# Patient Record
Sex: Male | Born: 1945
Health system: Southern US, Community
[De-identification: ages and names within clinical notes are randomized; demographics above are authoritative.]

## PROBLEM LIST (undated history)

## (undated) DIAGNOSIS — E669 Obesity, unspecified: Secondary | ICD-10-CM

## (undated) DIAGNOSIS — M1990 Unspecified osteoarthritis, unspecified site: Secondary | ICD-10-CM

## (undated) DIAGNOSIS — F32A Depression, unspecified: Secondary | ICD-10-CM

## (undated) DIAGNOSIS — Z9989 Dependence on other enabling machines and devices: Secondary | ICD-10-CM

## (undated) DIAGNOSIS — K635 Polyp of colon: Secondary | ICD-10-CM

## (undated) DIAGNOSIS — Z860100 Personal history of colon polyps, unspecified: Secondary | ICD-10-CM

## (undated) DIAGNOSIS — E785 Hyperlipidemia, unspecified: Secondary | ICD-10-CM

## (undated) DIAGNOSIS — G4733 Obstructive sleep apnea (adult) (pediatric): Secondary | ICD-10-CM

## (undated) DIAGNOSIS — N189 Chronic kidney disease, unspecified: Secondary | ICD-10-CM

## (undated) DIAGNOSIS — L4052 Psoriatic arthritis mutilans: Secondary | ICD-10-CM

## (undated) DIAGNOSIS — E119 Type 2 diabetes mellitus without complications: Secondary | ICD-10-CM

## (undated) DIAGNOSIS — I10 Essential (primary) hypertension: Secondary | ICD-10-CM

## (undated) HISTORY — DX: Type 2 diabetes mellitus without complications: E11.9

## (undated) HISTORY — DX: Obstructive sleep apnea (adult) (pediatric): G47.33

## (undated) HISTORY — PX: COLONOSCOPY: SHX174

## (undated) HISTORY — DX: Unspecified osteoarthritis, unspecified site: M19.90

## (undated) HISTORY — DX: Polyp of colon: K63.5

## (undated) HISTORY — DX: Chronic kidney disease, unspecified: N18.9

## (undated) HISTORY — DX: Essential (primary) hypertension: I10

## (undated) HISTORY — DX: Hyperlipidemia, unspecified: E78.5

## (undated) HISTORY — DX: Dependence on other enabling machines and devices: Z99.89

## (undated) HISTORY — DX: Personal history of colon polyps, unspecified: Z86.0100

## (undated) HISTORY — DX: Psoriatic arthritis mutilans: L40.52

## (undated) HISTORY — DX: Obesity, unspecified: E66.9

## (undated) HISTORY — DX: Depression, unspecified: F32.A

## (undated) HISTORY — DX: Personal history of colonic polyps: Z86.010

---

## 2012-09-04 ENCOUNTER — Other Ambulatory Visit: Payer: Self-pay | Admitting: Family Medicine

## 2012-09-04 DIAGNOSIS — R1310 Dysphagia, unspecified: Secondary | ICD-10-CM

## 2012-09-09 ENCOUNTER — Ambulatory Visit
Admission: RE | Admit: 2012-09-09 | Discharge: 2012-09-09 | Disposition: A | Discharge status: Home / Self Care | Payer: 59 | Source: Ambulatory Visit | Attending: Family Medicine | Admitting: Family Medicine

## 2012-09-09 DIAGNOSIS — R1310 Dysphagia, unspecified: Secondary | ICD-10-CM

## 2013-12-02 ENCOUNTER — Ambulatory Visit (INDEPENDENT_AMBULATORY_CARE_PROVIDER_SITE_OTHER): Payer: BLUE CROSS/BLUE SHIELD | Admitting: Psychiatry

## 2013-12-02 ENCOUNTER — Encounter (HOSPITAL_COMMUNITY): Payer: Self-pay | Admitting: Psychiatry

## 2013-12-02 VITALS — BP 149/76 | HR 98 | Ht 67.0 in | Wt 222.0 lb

## 2013-12-02 DIAGNOSIS — F4323 Adjustment disorder with mixed anxiety and depressed mood: Secondary | ICD-10-CM

## 2013-12-02 MED ORDER — BUPROPION HCL 75 MG PO TABS: 75.0000 mg | ORAL_TABLET | Freq: Two times a day (BID) | ORAL | Status: DC

## 2013-12-02 MED ORDER — VENLAFAXINE HCL ER 150 MG PO CP24: 150.0000 mg | ORAL_CAPSULE | Freq: Every day | ORAL | Status: AC

## 2013-12-02 NOTE — Progress Notes (Signed)
Psychiatric Assessment   Patient Identification:  Austin Acosta Date of Evaluation:  12/02/2013 Chief Complaint:     Chief Complaint  Patient presents with  . Depression   History of Chief Complaint:  HPI Comments: HPI Comments: [name] is  a 67 y/o male with a past psychiatric history significant for mood problems. The patient is referred for psychiatric services for psychiatric evaluation and medication management.    . Location: The patient endorses some depression.  . Quality: He reports some significant difficult with retirement and the loss of his social network along with his wife lack of support in his attempt to maintain those social networks.   The patient reports that his main stressors are:  "feeling depressed" "not able to do what he normally does."  In the area of affective symptoms, patient appears mildly depressed. Patient denies current suicidal ideation, intent, or plan. Patient deneis current homicidal ideation, intent, or plan. Patient denies auditory hallucinations. Patient denies visual hallucinations. Patient denies symptoms of paranoia. Patient states sleep is good. Appetite is bad. Energy level is poor. Patient endorses symptoms of anhedonia. Patient endorses hopelessness, helplessness, but denies guilt.   . Severity:  Depression: 5/10 (0=Very depressed; 5=Neutral; 10=Very Happy)  Anxiety- 0-1/10 (0=no anxiety; 5= moderate/tolerable anxiety; 10= panic attacks)  . Duration-Less than 5 years  . Timing-No specific timimng  . Context-loss of social network; loss/death of friends;   Marland Kitchen Modifying factors: Spending time with friends.  . Associated signs and symptoms: As noted below in psychiatric review of systems.   Review of Systems  Constitutional: Negative for fever, chills, activity change, appetite change and fatigue.  Respiratory: Negative for cough, chest tightness, shortness of breath and wheezing.   Cardiovascular: Negative for chest  pain, palpitations and leg swelling.  Gastrointestinal: Negative for nausea, vomiting, abdominal pain, diarrhea, constipation and abdominal distention.  Neurological: Negative for dizziness, tremors, facial asymmetry, weakness, light-headedness, numbness and headaches.   Filed Vitals:   12/02/13 1444  BP: 149/76  Pulse: 98  Height: 5\' 7"  (1.702 m)  Weight: 222 lb (100.699 kg)   Physical Exam  Vitals reviewed. Constitutional: He appears well-developed and well-nourished. No distress.  HENT:  Head: Normocephalic and atraumatic.  Skin: He is not diaphoretic.  Musculoskeletal: Strength & Muscle Tone: within normal limits Gait & Station: normal Patient leans: N/A  Mood Symptoms:  Anhedonia, Appetite, Concentration, Helplessness, Hopelessness, Sadness, Worthlessness,  (Hypo) Manic Symptoms: Elevated Mood:  Negative Irritable Mood:  Negative Grandiosity:  Negative Distractibility:  Negative Labiality of Mood:  Negative Delusions:  Negative Hallucinations:  Negative Impulsivity:  Negative Sexually Inappropriate Behavior:  Negative Financial Extravagance:  Negative Flight of Ideas:  Negative  Anxiety Symptoms: Excessive Worry:  No Panic Symptoms:  No Agoraphobia:  No Obsessive Compulsive: No  Symptoms: None, Specific Phobias:  No Social Anxiety:  No  Psychotic Symptoms:  Hallucinations: Negative None Delusions:  No Paranoia:  No   Ideas of Reference:  No  PTSD Symptoms: Ever had a traumatic exposure:  Negative Had a traumatic exposure in the last month:  Negative Re-experiencing: Negative None Hypervigilance:  Negative Hyperarousal: Negative None Avoidance: Negative None  Traumatic Brain Injury: Negative  Past Psychiatric History: Diagnosis:  Depression and anxiety  Hospitalizations:  Patient denies  Outpatient Care:  Yes in 2003; secondary to depression and severe anger issues,   Substance Abuse Care: Patient denies  Self-Mutilation:  Patient denies   Suicidal Attempts:  Patient denies  Violent Behaviors:  Patient denies   Past Medical  History:   Past Medical History  Diagnosis Date  . Arthritis   . Colon polyps     History of Loss of Consciousness:  Negative Seizure History:  Negative Cardiac History:  Negative  Allergies:   Allergies  Allergen Reactions  . Salmon [Fish Allergy]     Current Medications:  Current Outpatient Prescriptions  Medication Sig Dispense Refill  . buPROPion (WELLBUTRIN) 75 MG tablet Take 75 mg by mouth 2 (two) times daily.      . Certolizumab Pegol (CIMZIA [place]) Inject 200 mg into the skin.      . folic acid (FOLVITE) 1 MG tablet Take 1 mg by mouth daily.      Marland Kitchen venlafaxine XR (EFFEXOR-XR) 150 MG 24 hr capsule Take 150 mg by mouth daily with breakfast.       No current facility-administered medications for this visit.    Previous Psychotropic Medications:  Medication Dose   Paxil-loss of sex drive Unknown  Lexapro-loss of sex drive- Unknown  Venlafaxine-helped went to 225, no difference. 225 mg   Substance Abuse History in the last 12 months: History   Social History  . Marital Status: Married    Spouse Name: N/A    Number of Children: N/A  . Years of Education: N/A   Occupational History  . Not on file.   Social History Main Topics  . Smoking status: Not on file  . Smokeless tobacco: Not on file  . Alcohol Use: Not on file  . Drug Use: Not on file  . Sexual Activity: Not on file   Other Topics Concern  . Not on file   Social History Narrative  . No narrative on file     Medical Consequences of Substance Abuse: Patient denies  Legal Consequences of Substance Abuse:  Patient denies  Family Consequences of Substance Abuse: Patient denies  Blackouts:  Negative DT's:  Negative Withdrawal Symptoms: Negative None  Social History: Current Place of Residence: [place], [place] Place of Birth:  1946/11/30 Family Members: Wife Married: First wife Children: 2  Sons:  1  Daughters:1 Relationships: The patient reports his  School History:   Did poorly school. Legal History: The patient has no significant history of legal issues. Hobbies/Interests: Read.-Use to play golf, used hiking, flying fishing. Religous Beliefs  Family History:   Family History  Problem Relation Age of Onset  . Depression Brother   . Schizophrenia Brother   . Paranoid behavior Brother   . ADD / ADHD Neg Hx   . Alcohol abuse Neg Hx   . Anxiety disorder Neg Hx   . Bipolar disorder Neg Hx   . Diabetes Mellitus II Neg Hx   . Coronary artery disease Father     Psychiatric Specialty Exam: : Objective:  Appearance: Casual and Well Groomed  Eye Contact::  Good  Speech:  Clear and Coherent and Normal Rate  Volume:  Normal  Mood:  "good" Depression: 5/10 (0=Very depressed; 5=Neutral; 10=Very Happy)  Anxiety- 0-1/10 (0=no anxiety; 5= moderate/tolerable anxiety; 10= panic attacks)  Affect:  Appropriate, Congruent and Full Range  Thought Process:  Coherent, Goal Directed, Linear and Logical  Orientation:  Full (Time, Place, and Person)  Thought Content:  WDL  Suicidal Thoughts:  No  Homicidal Thoughts:  No  Judgement:  Good  Memory: Immediate 3/3; Recent 2/3  Insight:  Good  Psychomotor Activity:  Normal  Akathisia:  No  Handed:  Right  AIMS (if indicated):  Not indicated  Assets:  Communication Skills Desire  for Improvement Financial Resources/Insurance Housing Intimacy Leisure Time Physical Health Resilience Social Support Talents/Skills Transportation Vocational/Educational    Laboratory/X-Ray Psychological Evaluation(s)   Not indicated  Not indicated   Assessment:    AXIS I Adjustment Disorder with Mixed Emotional Features  AXIS II No diagnosis  AXIS III Past Medical History  Diagnosis Date  . Arthritis   . Colon polyps     AXIS IV other psychosocial or environmental problems  AXIS V 41-50 serious symptoms   Treatment  Plan/Recommendations:  Plan of Care:  PLAN:  1. Affirm with the patient that the medications are taken as ordered. Patient  expressed understanding of how their medications were to be used.    Laboratory: No labs warranted at this time.   Psychotherapy: Therapy: brief supportive therapy provided.  Discussed psychosocial stressors in detail.  Patient may benefit from individual therapy.  Medications:  Continue  the following psychiatric medications as written prior to this appointment with the following changes::  a) Venlafaxine ER 150 mg b) Bupropion 75 mg BID -Risks and benefits, side effects and alternatives discussed with patient, she was given an opportunity to ask questions about his/her medication, illness, and treatment. All current psychiatric medications have been reviewed and discussed with the patient and adjusted as clinically appropriate. The patient has been provided an accurate and updated list of the medications being now prescribed.   Routine PRN Medications:  Negative  Consultations: The patient was encouraged to keep all PCP and specialty clinic appointments.   Safety Concerns:   Patient told to call clinic if any problems occur. Patient advised to go to  ER  if she should develop SI/HI, side effects, or if symptoms worsen. Has crisis numbers to call if needed.    Other:   8. Patient was instructed to return to clinic in 3 months.  9. The patient was advised to call and cancel their mental health appointment within 24 hours of the appointment, if they are unable to keep the appointment, as well as the three no show and termination from clinic policy. 10. The patient expressed understanding of the plan and agrees with the above.  Time Spent: 60 minutes  Jacqulyn Cane, MD 12/31/20142:25 PM

## 2013-12-06 DIAGNOSIS — F4323 Adjustment disorder with mixed anxiety and depressed mood: Secondary | ICD-10-CM | POA: Insufficient documentation

## 2013-12-16 DIAGNOSIS — L408 Other psoriasis: Secondary | ICD-10-CM | POA: Diagnosis not present

## 2013-12-16 DIAGNOSIS — L4050 Arthropathic psoriasis, unspecified: Secondary | ICD-10-CM | POA: Diagnosis not present

## 2013-12-31 DIAGNOSIS — M069 Rheumatoid arthritis, unspecified: Secondary | ICD-10-CM | POA: Diagnosis not present

## 2013-12-31 DIAGNOSIS — L408 Other psoriasis: Secondary | ICD-10-CM | POA: Diagnosis not present

## 2014-01-29 DIAGNOSIS — L4050 Arthropathic psoriasis, unspecified: Secondary | ICD-10-CM | POA: Diagnosis not present

## 2014-01-29 DIAGNOSIS — E669 Obesity, unspecified: Secondary | ICD-10-CM | POA: Diagnosis not present

## 2014-01-29 DIAGNOSIS — Z6834 Body mass index (BMI) 34.0-34.9, adult: Secondary | ICD-10-CM | POA: Diagnosis not present

## 2014-01-29 DIAGNOSIS — R030 Elevated blood-pressure reading, without diagnosis of hypertension: Secondary | ICD-10-CM | POA: Diagnosis not present

## 2014-01-29 DIAGNOSIS — Z0000 Encounter for general adult medical examination without abnormal findings: Secondary | ICD-10-CM | POA: Diagnosis not present

## 2014-01-29 DIAGNOSIS — E782 Mixed hyperlipidemia: Secondary | ICD-10-CM | POA: Diagnosis not present

## 2014-01-29 DIAGNOSIS — F329 Major depressive disorder, single episode, unspecified: Secondary | ICD-10-CM | POA: Diagnosis not present

## 2014-01-29 DIAGNOSIS — Z125 Encounter for screening for malignant neoplasm of prostate: Secondary | ICD-10-CM | POA: Diagnosis not present

## 2014-01-29 DIAGNOSIS — Z23 Encounter for immunization: Secondary | ICD-10-CM | POA: Diagnosis not present

## 2014-02-02 DIAGNOSIS — L4050 Arthropathic psoriasis, unspecified: Secondary | ICD-10-CM | POA: Diagnosis not present

## 2014-02-22 ENCOUNTER — Ambulatory Visit (HOSPITAL_COMMUNITY): Payer: Self-pay | Admitting: Psychiatry

## 2014-02-24 DIAGNOSIS — M069 Rheumatoid arthritis, unspecified: Secondary | ICD-10-CM | POA: Diagnosis not present

## 2014-03-02 DIAGNOSIS — L408 Other psoriasis: Secondary | ICD-10-CM | POA: Diagnosis not present

## 2014-03-04 ENCOUNTER — Ambulatory Visit (HOSPITAL_COMMUNITY): Payer: Self-pay | Admitting: Psychiatry

## 2014-03-17 DIAGNOSIS — L408 Other psoriasis: Secondary | ICD-10-CM | POA: Diagnosis not present

## 2014-03-17 DIAGNOSIS — L4050 Arthropathic psoriasis, unspecified: Secondary | ICD-10-CM | POA: Diagnosis not present

## 2014-03-31 DIAGNOSIS — L4050 Arthropathic psoriasis, unspecified: Secondary | ICD-10-CM | POA: Diagnosis not present

## 2014-04-29 DIAGNOSIS — L4050 Arthropathic psoriasis, unspecified: Secondary | ICD-10-CM | POA: Diagnosis not present

## 2014-05-27 DIAGNOSIS — L4050 Arthropathic psoriasis, unspecified: Secondary | ICD-10-CM | POA: Diagnosis not present

## 2014-06-24 DIAGNOSIS — L4050 Arthropathic psoriasis, unspecified: Secondary | ICD-10-CM | POA: Diagnosis not present

## 2014-07-21 DIAGNOSIS — M19079 Primary osteoarthritis, unspecified ankle and foot: Secondary | ICD-10-CM | POA: Diagnosis not present

## 2014-07-21 DIAGNOSIS — L408 Other psoriasis: Secondary | ICD-10-CM | POA: Diagnosis not present

## 2014-07-21 DIAGNOSIS — L4050 Arthropathic psoriasis, unspecified: Secondary | ICD-10-CM | POA: Diagnosis not present

## 2014-07-26 DIAGNOSIS — L4050 Arthropathic psoriasis, unspecified: Secondary | ICD-10-CM | POA: Diagnosis not present

## 2014-08-30 DIAGNOSIS — L4050 Arthropathic psoriasis, unspecified: Secondary | ICD-10-CM | POA: Diagnosis not present

## 2014-09-29 DIAGNOSIS — L4050 Arthropathic psoriasis, unspecified: Secondary | ICD-10-CM | POA: Diagnosis not present

## 2014-11-01 DIAGNOSIS — L4050 Arthropathic psoriasis, unspecified: Secondary | ICD-10-CM | POA: Diagnosis not present

## 2014-11-16 DIAGNOSIS — K648 Other hemorrhoids: Secondary | ICD-10-CM | POA: Diagnosis not present

## 2014-11-16 DIAGNOSIS — K5730 Diverticulosis of large intestine without perforation or abscess without bleeding: Secondary | ICD-10-CM | POA: Diagnosis not present

## 2014-11-16 DIAGNOSIS — Z1211 Encounter for screening for malignant neoplasm of colon: Secondary | ICD-10-CM | POA: Diagnosis not present

## 2014-11-16 DIAGNOSIS — Z86010 Personal history of colonic polyps: Secondary | ICD-10-CM | POA: Diagnosis not present

## 2014-11-16 DIAGNOSIS — Z09 Encounter for follow-up examination after completed treatment for conditions other than malignant neoplasm: Secondary | ICD-10-CM | POA: Diagnosis not present

## 2014-11-18 DIAGNOSIS — L4050 Arthropathic psoriasis, unspecified: Secondary | ICD-10-CM | POA: Diagnosis not present

## 2014-11-18 DIAGNOSIS — L401 Generalized pustular psoriasis: Secondary | ICD-10-CM | POA: Diagnosis not present

## 2014-11-18 DIAGNOSIS — Z23 Encounter for immunization: Secondary | ICD-10-CM | POA: Diagnosis not present

## 2014-11-29 DIAGNOSIS — L4050 Arthropathic psoriasis, unspecified: Secondary | ICD-10-CM | POA: Diagnosis not present

## 2014-12-31 DIAGNOSIS — L4050 Arthropathic psoriasis, unspecified: Secondary | ICD-10-CM | POA: Diagnosis not present

## 2015-02-02 DIAGNOSIS — L4050 Arthropathic psoriasis, unspecified: Secondary | ICD-10-CM | POA: Diagnosis not present

## 2015-03-10 DIAGNOSIS — L4050 Arthropathic psoriasis, unspecified: Secondary | ICD-10-CM | POA: Diagnosis not present

## 2015-03-14 DIAGNOSIS — H40053 Ocular hypertension, bilateral: Secondary | ICD-10-CM | POA: Diagnosis not present

## 2015-03-14 DIAGNOSIS — H40013 Open angle with borderline findings, low risk, bilateral: Secondary | ICD-10-CM | POA: Diagnosis not present

## 2015-03-14 DIAGNOSIS — H52203 Unspecified astigmatism, bilateral: Secondary | ICD-10-CM | POA: Diagnosis not present

## 2015-03-14 DIAGNOSIS — H2513 Age-related nuclear cataract, bilateral: Secondary | ICD-10-CM | POA: Diagnosis not present

## 2015-03-22 DIAGNOSIS — R5383 Other fatigue: Secondary | ICD-10-CM | POA: Diagnosis not present

## 2015-03-22 DIAGNOSIS — Z125 Encounter for screening for malignant neoplasm of prostate: Secondary | ICD-10-CM | POA: Diagnosis not present

## 2015-03-22 DIAGNOSIS — G4730 Sleep apnea, unspecified: Secondary | ICD-10-CM | POA: Diagnosis not present

## 2015-03-22 DIAGNOSIS — R6882 Decreased libido: Secondary | ICD-10-CM | POA: Diagnosis not present

## 2015-03-22 DIAGNOSIS — L4052 Psoriatic arthritis mutilans: Secondary | ICD-10-CM | POA: Diagnosis not present

## 2015-03-22 DIAGNOSIS — E782 Mixed hyperlipidemia: Secondary | ICD-10-CM | POA: Diagnosis not present

## 2015-03-22 DIAGNOSIS — R030 Elevated blood-pressure reading, without diagnosis of hypertension: Secondary | ICD-10-CM | POA: Diagnosis not present

## 2015-03-22 DIAGNOSIS — Z6834 Body mass index (BMI) 34.0-34.9, adult: Secondary | ICD-10-CM | POA: Diagnosis not present

## 2015-03-22 DIAGNOSIS — Z0000 Encounter for general adult medical examination without abnormal findings: Secondary | ICD-10-CM | POA: Diagnosis not present

## 2015-03-22 DIAGNOSIS — F339 Major depressive disorder, recurrent, unspecified: Secondary | ICD-10-CM | POA: Diagnosis not present

## 2015-03-22 DIAGNOSIS — E6609 Other obesity due to excess calories: Secondary | ICD-10-CM | POA: Diagnosis not present

## 2015-03-28 ENCOUNTER — Telehealth: Payer: Self-pay | Admitting: Cardiovascular Disease

## 2015-03-28 NOTE — Telephone Encounter (Signed)
03/28/2015 we received incoming fax referral packet on patient from [place] at [facility] it was 16 pages for appointment on 06/10/2015. Records given to [facility]. cbr

## 2015-03-29 DIAGNOSIS — R7309 Other abnormal glucose: Secondary | ICD-10-CM | POA: Diagnosis not present

## 2015-03-29 DIAGNOSIS — E291 Testicular hypofunction: Secondary | ICD-10-CM | POA: Diagnosis not present

## 2015-04-14 DIAGNOSIS — L4050 Arthropathic psoriasis, unspecified: Secondary | ICD-10-CM | POA: Diagnosis not present

## 2015-04-28 DIAGNOSIS — I10 Essential (primary) hypertension: Secondary | ICD-10-CM | POA: Diagnosis not present

## 2015-04-28 DIAGNOSIS — Z79899 Other long term (current) drug therapy: Secondary | ICD-10-CM | POA: Diagnosis not present

## 2015-05-18 ENCOUNTER — Ambulatory Visit: Payer: Self-pay | Admitting: Cardiovascular Disease

## 2015-05-24 DIAGNOSIS — L4050 Arthropathic psoriasis, unspecified: Secondary | ICD-10-CM | POA: Diagnosis not present

## 2015-06-10 ENCOUNTER — Encounter: Payer: Self-pay | Admitting: Cardiovascular Disease

## 2015-06-10 ENCOUNTER — Ambulatory Visit (INDEPENDENT_AMBULATORY_CARE_PROVIDER_SITE_OTHER): Payer: Medicare Other | Admitting: Cardiovascular Disease

## 2015-06-10 VITALS — BP 124/72 | HR 87 | Ht 67.0 in | Wt 218.0 lb

## 2015-06-10 DIAGNOSIS — E785 Hyperlipidemia, unspecified: Secondary | ICD-10-CM

## 2015-06-10 DIAGNOSIS — L4052 Psoriatic arthritis mutilans: Secondary | ICD-10-CM

## 2015-06-10 DIAGNOSIS — E669 Obesity, unspecified: Secondary | ICD-10-CM

## 2015-06-10 DIAGNOSIS — Z8249 Family history of ischemic heart disease and other diseases of the circulatory system: Secondary | ICD-10-CM

## 2015-06-10 DIAGNOSIS — I10 Essential (primary) hypertension: Secondary | ICD-10-CM

## 2015-06-10 DIAGNOSIS — G4733 Obstructive sleep apnea (adult) (pediatric): Secondary | ICD-10-CM

## 2015-06-10 DIAGNOSIS — Z9989 Dependence on other enabling machines and devices: Secondary | ICD-10-CM

## 2015-06-10 NOTE — Patient Instructions (Signed)
Your physician recommends that you schedule a follow-up appointment in: [place].  BRING YOUR CPAP MACHINE TO THAT VISIT.  Your physician has requested that you have an exercise tolerance test. For further information please visit [email or web address]. Please also follow instruction sheet, as given.  WE WILL CALL YOU WITH THE RESULTS.  Dr. Sallyanne Kuster recommends that you schedule a follow-up appointment in: AS NEEDED

## 2015-06-11 ENCOUNTER — Encounter: Payer: Self-pay | Admitting: Cardiovascular Disease

## 2015-06-11 DIAGNOSIS — L4052 Psoriatic arthritis mutilans: Secondary | ICD-10-CM

## 2015-06-11 DIAGNOSIS — G4733 Obstructive sleep apnea (adult) (pediatric): Secondary | ICD-10-CM

## 2015-06-11 DIAGNOSIS — E785 Hyperlipidemia, unspecified: Secondary | ICD-10-CM | POA: Insufficient documentation

## 2015-06-11 DIAGNOSIS — Z9989 Dependence on other enabling machines and devices: Secondary | ICD-10-CM

## 2015-06-11 DIAGNOSIS — E66811 Obesity, class 1: Secondary | ICD-10-CM

## 2015-06-11 DIAGNOSIS — I10 Essential (primary) hypertension: Secondary | ICD-10-CM | POA: Insufficient documentation

## 2015-06-11 DIAGNOSIS — E669 Obesity, unspecified: Secondary | ICD-10-CM

## 2015-06-11 HISTORY — DX: Psoriatic arthritis mutilans: L40.52

## 2015-06-11 HISTORY — DX: Hyperlipidemia, unspecified: E78.5

## 2015-06-11 HISTORY — DX: Essential (primary) hypertension: I10

## 2015-06-11 HISTORY — DX: Obesity, unspecified: E66.9

## 2015-06-11 HISTORY — DX: Obesity, class 1: E66.811

## 2015-06-11 HISTORY — DX: Obstructive sleep apnea (adult) (pediatric): G47.33

## 2015-06-11 NOTE — Progress Notes (Signed)
Patient ID: Austin Acosta, male   DOB: March 28, 1946, 69 y.o.   MRN: [id number]     Cardiology Office Note   Date:  06/11/2015   ID:  Austin Acosta, DOB [birth year], MRN [id number]  PCP:  Mayra Neer, MD  Cardiologist:   Sanda Klein, MD   Chief Complaint  Patient presents with  . Advice Only    Patient has no complaints.      History of Present Illness: Austin Acosta is a 69 y.o. male who presents for  Concerns regarding cardiovascular risk factors. He has never had coronary peripheral arterial problems , but his sister had bypass surgery at age 25. Both his parents had coronary disease as well but more advanced ages in their 47s.   [name] has treated systemic hypertension, moderate obesity , mild hyperlipidemia (primarily elevated LDL cholesterol) , but he has never smoked.  He is very sedentary but does mow his own small lone with a push mower without complaints of chest tightness or intermittent claudication. He believes that for his level of fitness he does not have excessive shortness of breath. On the other hand she describes being tired all the time. He wakes up feeling tired. He has "no spunk". He has been on CPAP therapy for at least 6 years, but has not had a CPAP titration since he had to find a new device provider when hee moved to [place] 5 years ago. His device has not been downloaded since that time. He was recently diagnosed with hypo- testosteronemia , but has yet to start taking supplements.  Past Medical History  Diagnosis Date  . Arthritis   . Colon polyps   . Hyperlipidemia 06/11/2015  . Obstructive sleep apnea on CPAP 06/11/2015  . Obesity (BMI 30.0-34.9) 06/11/2015    History reviewed. No pertinent past surgical history.   Current Outpatient Prescriptions  Medication Sig Dispense Refill  . buPROPion (WELLBUTRIN XL) 150 MG 24 hr tablet Take 150 mg by mouth daily.    . Certolizumab Pegol (CIMZIA [place]) Inject into the skin. monthly    .  lisinopril (PRINIVIL,ZESTRIL) 20 MG tablet Take 20 mg by mouth daily.    Marland Kitchen venlafaxine XR (EFFEXOR-XR) 150 MG 24 hr capsule Take 1 capsule (150 mg total) by mouth daily with breakfast. 30 capsule 2   No current facility-administered medications for this visit.    Allergies:   Salmon    Social History:  The patient  reports that he quit smoking about 31 years ago. His smoking use included Cigarettes. He has a 30 pack-year smoking history. He does not have any smokeless tobacco history on file. He reports that he drinks about 0.5 - 1.0 oz of alcohol per week. He reports that he does not use illicit drugs.   Family History:  The patient's family history includes Coronary artery disease in his father; Depression in his brother; Heart disease in his mother; Paranoid behavior in his brother; Schizophrenia in his brother. There is no history of ADD / ADHD, Alcohol abuse, Anxiety disorder, Bipolar disorder, or Diabetes Mellitus II.    ROS:  Please see the history of present illness.    Otherwise, review of systems positive for  Depression,  Psoriatic skin rash and arthritis symptoms.   All other systems are reviewed and negative.    PHYSICAL EXAM: VS:  BP 124/72 mmHg  Pulse 87  Ht 5\' 7"  (1.702 m)  Wt 218 lb (98.884 kg)  BMI 34.14 kg/m2 , BMI Body mass index is  34.14 kg/(m^2).  General: Alert, oriented x3, no distress Head: no evidence of trauma, PERRL, EOMI, no exophtalmos or lid lag, no myxedema, no xanthelasma; normal ears, nose and oropharynx Neck: normal jugular venous pulsations and no hepatojugular reflux; brisk carotid pulses without delay and no carotid bruits Chest: clear to auscultation, no signs of consolidation by percussion or palpation, normal fremitus, symmetrical and full respiratory excursions Cardiovascular: normal position and quality of the apical impulse, regular rhythm, normal first and second heart sounds, no murmurs, rubs or gallops Abdomen: no tenderness or distention,  no masses by palpation, no abnormal pulsatility or arterial bruits, normal bowel sounds, no hepatosplenomegaly Extremities: no clubbing, cyanosis or edema; 2+ radial, ulnar and brachial pulses bilaterally; 2+ right femoral, posterior tibial and dorsalis pedis pulses; 2+ left femoral, posterior tibial and dorsalis pedis pulses; no subclavian or femoral bruits Neurological: grossly nonfocal Psych: euthymic mood, full affect   EKG:  EKG is not ordered today. The ekg ordered 04/08/2015 demonstrates  Normal sinus rhythm, normal tracing, QTc 421 ms   Recent Labs:  03/22/2015 hemoglobin 14.9, platelets 228 , glucose 129, creatinine 1.07, potassium 4.7, normal liver function tests , total cholesterol 204, triglycerides 172, HDL 50, LDL 119 , TSH 2.89, testosterone 187.7  Wt Readings from Last 3 Encounters:  06/10/15 218 lb (98.884 kg)  12/02/13 222 lb (100.699 kg)      Other studies Reviewed: Additional studies/ records that were reviewed today include: records from Dr. Mayra Neer.   ASSESSMENT AND PLAN:  1.  Fatigue and OSA -  While this may be related to depression and/or hyperandrogenism, I believe a leading possibility is that his current CPAP settings are not appropriate year they have not been reviewed in many years. It sounds like his CPAP machine does have a memory chip. I have advised that he have a sleep clinic evaluation at least once with Dr. Ellouise Newer to make sure that the device settings do not need to be reprogrammed. I also encouraged him to use the AndroGel supplements prescribed by Dr. Brigitte Pulse.  2.  Obesity -  This underlies his tendency to develop diabetes mellitus and hypertension and sleep apnea and may also be partly responsible for his elevated LDL cholesterol. Weight loss is strongly recommended. He does not appear at all motivated. He does not enjoy physical activity low in the past he used to golf and play tennis. We discussed healthy dietary changes as well.  3.   Multiple coronary risk factors -  I recommended that he undergo a simple treadmill stress test. In the absence of established vascular disease his current level of LDL cholesterol does not justify starting treatment with a statin , but if he does develop full-blown diabetes or any vascular disease equivalent is identified a statin would definitely be indicated.    Current medicines are reviewed at length with the patient today.  The patient does not have concerns regarding medicines.  The following changes have been made:  no change  Labs/ tests ordered today include:  Orders Placed This Encounter  Procedures  . Exercise Tolerance Test     Patient Instructions  Your physician recommends that you schedule a follow-up appointment in: [place].  BRING YOUR CPAP MACHINE TO THAT VISIT.  Your physician has requested that you have an exercise tolerance test. For further information please visit [email or web address]. Please also follow instruction sheet, as given.  WE WILL CALL YOU WITH THE RESULTS.  Dr. Sallyanne Kuster  recommends that you schedule a follow-up appointment in: AS NEEDED        Signed, Sanda Klein, MD  06/11/2015 8:34 AM    Sanda Klein, MD, [facility] [phone] office [phone] pager

## 2015-06-23 ENCOUNTER — Ambulatory Visit: Payer: Self-pay | Admitting: Cardiovascular Disease

## 2015-06-23 DIAGNOSIS — L4050 Arthropathic psoriasis, unspecified: Secondary | ICD-10-CM | POA: Diagnosis not present

## 2015-06-28 ENCOUNTER — Encounter (HOSPITAL_COMMUNITY): Payer: Self-pay

## 2015-06-30 ENCOUNTER — Telehealth (HOSPITAL_COMMUNITY): Payer: Self-pay

## 2015-06-30 NOTE — Telephone Encounter (Signed)
Encounter complete. 

## 2015-07-01 ENCOUNTER — Telehealth (HOSPITAL_COMMUNITY): Payer: Self-pay

## 2015-07-01 NOTE — Telephone Encounter (Signed)
Encounter complete. 

## 2015-07-05 ENCOUNTER — Encounter (HOSPITAL_COMMUNITY): Payer: Self-pay | Admitting: *Deleted

## 2015-07-05 ENCOUNTER — Encounter (HOSPITAL_COMMUNITY): Payer: Self-pay

## 2015-07-05 ENCOUNTER — Ambulatory Visit (HOSPITAL_COMMUNITY)
Admission: RE | Admit: 2015-07-05 | Discharge: 2015-07-05 | Disposition: A | Discharge status: Home / Self Care | Payer: Medicare Other | Source: Ambulatory Visit | Attending: Cardiovascular Disease | Admitting: Cardiovascular Disease

## 2015-07-05 DIAGNOSIS — Z8249 Family history of ischemic heart disease and other diseases of the circulatory system: Secondary | ICD-10-CM | POA: Diagnosis not present

## 2015-07-05 DIAGNOSIS — R9439 Abnormal result of other cardiovascular function study: Secondary | ICD-10-CM | POA: Insufficient documentation

## 2015-07-05 LAB — EXERCISE TOLERANCE TEST
CHL CUP MPHR: 151 {beats}/min
CHL RATE OF PERCEIVED EXERTION: 17
CSEPEW: 8.5 METS
CSEPHR: 98 %
CSEPPHR: 148 {beats}/min
Exercise duration (min): 7 min
Rest HR: 81 {beats}/min

## 2015-07-05 NOTE — Progress Notes (Unsigned)
Patient has ST changes, ok'd by Dr. Gwenlyn Found to go home.

## 2015-07-08 ENCOUNTER — Telehealth: Payer: Self-pay | Admitting: Cardiovascular Disease

## 2015-07-08 ENCOUNTER — Other Ambulatory Visit: Payer: Self-pay | Admitting: *Deleted

## 2015-07-08 DIAGNOSIS — Z9989 Dependence on other enabling machines and devices: Secondary | ICD-10-CM

## 2015-07-08 DIAGNOSIS — Z87891 Personal history of nicotine dependence: Secondary | ICD-10-CM

## 2015-07-08 DIAGNOSIS — Z8249 Family history of ischemic heart disease and other diseases of the circulatory system: Secondary | ICD-10-CM

## 2015-07-08 DIAGNOSIS — E785 Hyperlipidemia, unspecified: Secondary | ICD-10-CM

## 2015-07-08 DIAGNOSIS — G4733 Obstructive sleep apnea (adult) (pediatric): Secondary | ICD-10-CM

## 2015-07-08 DIAGNOSIS — R9439 Abnormal result of other cardiovascular function study: Secondary | ICD-10-CM

## 2015-07-08 DIAGNOSIS — I10 Essential (primary) hypertension: Secondary | ICD-10-CM

## 2015-07-08 DIAGNOSIS — R0602 Shortness of breath: Secondary | ICD-10-CM

## 2015-07-08 DIAGNOSIS — R5383 Other fatigue: Secondary | ICD-10-CM

## 2015-07-08 NOTE — Progress Notes (Signed)
Order placed for lexiscan myoview due to abnormal EKG per [facility]

## 2015-07-08 NOTE — Telephone Encounter (Signed)
Closed encounter °

## 2015-07-12 ENCOUNTER — Ambulatory Visit (HOSPITAL_COMMUNITY)
Admission: RE | Admit: 2015-07-12 | Discharge: 2015-07-12 | Disposition: A | Discharge status: Home / Self Care | Payer: Medicare Other | Source: Ambulatory Visit | Attending: Urology | Admitting: Urology

## 2015-07-12 DIAGNOSIS — Z720 Tobacco use: Secondary | ICD-10-CM

## 2015-07-12 DIAGNOSIS — Z8249 Family history of ischemic heart disease and other diseases of the circulatory system: Secondary | ICD-10-CM | POA: Diagnosis not present

## 2015-07-12 DIAGNOSIS — G4733 Obstructive sleep apnea (adult) (pediatric): Secondary | ICD-10-CM | POA: Diagnosis not present

## 2015-07-12 DIAGNOSIS — Z87891 Personal history of nicotine dependence: Secondary | ICD-10-CM

## 2015-07-12 DIAGNOSIS — I10 Essential (primary) hypertension: Secondary | ICD-10-CM | POA: Diagnosis not present

## 2015-07-12 DIAGNOSIS — E785 Hyperlipidemia, unspecified: Secondary | ICD-10-CM | POA: Insufficient documentation

## 2015-07-12 DIAGNOSIS — Z9989 Dependence on other enabling machines and devices: Secondary | ICD-10-CM

## 2015-07-12 DIAGNOSIS — R9439 Abnormal result of other cardiovascular function study: Secondary | ICD-10-CM | POA: Diagnosis not present

## 2015-07-12 LAB — MYOCARDIAL PERFUSION IMAGING
CHL CUP NUCLEAR SDS: 1
CHL CUP RESTING HR STRESS: 65 {beats}/min
LVDIAVOL: 102 mL
LVSYSVOL: 50 mL
NUC STRESS TID: 1.59
Peak HR: 86 {beats}/min
SRS: 0
SSS: 1

## 2015-07-12 MED ORDER — REGADENOSON 0.4 MG/5ML IV SOLN
0.4000 mg | Freq: Once | INTRAVENOUS | Status: AC
  Administered 2015-07-12: 0.4 mg via INTRAVENOUS

## 2015-07-12 MED ORDER — AMINOPHYLLINE 25 MG/ML IV SOLN
75.0000 mg | Freq: Once | INTRAVENOUS | Status: AC
  Administered 2015-07-12: 75 mg via INTRAVENOUS

## 2015-07-12 MED ORDER — TECHNETIUM TC 99M SESTAMIBI GENERIC - CARDIOLITE
30.2000 | Freq: Once | INTRAVENOUS | Status: AC | PRN
  Administered 2015-07-12: 30.2 via INTRAVENOUS

## 2015-07-12 MED ORDER — TECHNETIUM TC 99M SESTAMIBI GENERIC - CARDIOLITE
10.4000 | Freq: Once | INTRAVENOUS | Status: AC | PRN
  Administered 2015-07-12: 10 via INTRAVENOUS

## 2015-07-12 NOTE — Progress Notes (Signed)
LM - will call back in AM.

## 2015-07-18 ENCOUNTER — Ambulatory Visit: Payer: Self-pay | Admitting: Cardiovascular Disease

## 2015-07-19 DIAGNOSIS — E291 Testicular hypofunction: Secondary | ICD-10-CM | POA: Diagnosis not present

## 2015-08-02 DIAGNOSIS — L401 Generalized pustular psoriasis: Secondary | ICD-10-CM | POA: Diagnosis not present

## 2015-08-02 DIAGNOSIS — L4050 Arthropathic psoriasis, unspecified: Secondary | ICD-10-CM | POA: Diagnosis not present

## 2015-08-19 DIAGNOSIS — E291 Testicular hypofunction: Secondary | ICD-10-CM | POA: Diagnosis not present

## 2015-08-25 ENCOUNTER — Other Ambulatory Visit (HOSPITAL_COMMUNITY): Payer: Self-pay | Admitting: Pharmacist

## 2015-08-30 ENCOUNTER — Other Ambulatory Visit (HOSPITAL_COMMUNITY): Payer: Medicare Other | Admitting: Internal Medicine

## 2015-08-30 ENCOUNTER — Other Ambulatory Visit (HOSPITAL_COMMUNITY): Payer: Self-pay | Admitting: Internal Medicine

## 2015-08-30 ENCOUNTER — Encounter (HOSPITAL_COMMUNITY)
Admission: RE | Admit: 2015-08-30 | Discharge: 2015-08-30 | Disposition: A | Discharge status: Home / Self Care | Payer: Medicare Other | Source: Ambulatory Visit | Attending: Internal Medicine | Admitting: Internal Medicine

## 2015-08-30 ENCOUNTER — Encounter (HOSPITAL_COMMUNITY): Payer: Self-pay

## 2015-08-30 DIAGNOSIS — L4050 Arthropathic psoriasis, unspecified: Secondary | ICD-10-CM | POA: Insufficient documentation

## 2015-08-30 MED ORDER — USTEKINUMAB 45 MG/0.5ML ~~LOC~~ SOSY
45.0000 mg | PREFILLED_SYRINGE | Freq: Once | SUBCUTANEOUS | Status: AC
  Administered 2015-08-30: 45 mg via SUBCUTANEOUS
  Filled 2015-08-30: qty 0.5

## 2015-08-30 NOTE — Discharge Instructions (Signed)
Ustekinumab injection What is this medicine? USTEKINUMAB ([place] te KIN ue mab) is used to treat plaque psoriasis and psoriatic arthritis. It is not a cure. This medicine may be used for other purposes; ask your health care provider or pharmacist if you have questions. COMMON BRAND NAME(S): Stelara What should I tell my health care provider before I take this medicine? They need to know if you have any of these conditions: -cancer -diabetes -immune system problems -infection (especially a virus infection such as chickenpox, cold sores, or herpes)or history of infections -receiving or have received allergy shots -recently received or scheduled to receive a vaccine -tuberculosis, a positive skin test for tuberculosis, or have recently been in close contact with someone who has tuberculosis -an unusual reaction to ustekinumab, other medicines, foods, dyes, or preservatives -pregnant or trying to get pregnant -breast-feeding How should I use this medicine? This medicine is for injection under the skin. You will be taught how to prepare and give this medicine. Use exactly as directed. Take your medicine at regular intervals. Do not take your medicine more often than directed. It is important that you put your used needles and syringes in a special sharps container. Do not put them in a trash can. If you do not have a sharps container, call your pharmacist or healthcare provider to get one. A special MedGuide will be given to you by the pharmacist with each prescription and refill. Be sure to read this information carefully each time. Talk to your pediatrician regarding the use of this medicine in children. Special care may be needed. Overdosage: If you think you've taken too much of this medicine contact a poison control center or emergency room at once. Overdosage: If you think you have taken too much of this medicine contact a poison control center or emergency room at once. NOTE: This medicine is  only for you. Do not share this medicine with others. What if I miss a dose? If you miss a dose, take it as soon as you can. If it is almost time for your next dose, take only that dose. Do not take double or extra doses. What may interact with this medicine? Do not take this medicine with any of the following medications: -live virus vaccines This medicine may also interact with the following medications: -cyclosporine -immunosuppressives -vaccines -warfarin This list may not describe all possible interactions. Give your health care provider a list of all the medicines, herbs, non-prescription drugs, or dietary supplements you use. Also tell them if you smoke, drink alcohol, or use illegal drugs. Some items may interact with your medicine. What should I watch for while using this medicine? Your condition will be monitored carefully while you are receiving this medicine. Tell your doctor or healthcare professional if your symptoms do not start to get better or if they get worse. You will be tested for tuberculosis (TB) before you start this medicine. If your doctor prescribes any medicine for TB, you should start taking the TB medicine before starting this medicine. Make sure to finish the full course of TB medicine. Call your doctor or health care professional if you get a cold or other infection while receiving this medicine. Do not treat yourself. This medicine may decrease your body's ability to fight infection. Talk to your doctor about your risk of cancer. You may be more at risk for certain types of cancers if you take this medicine. What side effects may I notice from receiving this medicine? Side effects that you  should report to your doctor or health care professional as soon as possible: -allergic reactions like skin rash, itching or hives, swelling of the face, lips, or tongue -breathing problems -changes in vision -confusion -fever, chills, or any other sign of  infection -seizures -swollen lymph nodes in the neck, underarm, or groin areas -unexplained weight loss -unusually weak or tired Side effects that usually do not require medical attention (Report these to your doctor or health care professional if they continue or are bothersome.): -headache -redness, itching, swelling, or bruising at site where injected This list may not describe all possible side effects. Call your doctor for medical advice about side effects. You may report side effects to FDA at 1-800-FDA-1088. Where should I keep my medicine? Keep out of the reach of children. If you are using this medicine at home, you will be instructed on how to store this medicine. Throw away any unused medicine after the expiration date on the label. NOTE: This sheet is a summary. It may not cover all possible information. If you have questions about this medicine, talk to your doctor, pharmacist, or health care provider.  2015, [employer]vier/Gold Standard. (2012-11-04 16:31:45)

## 2015-08-30 NOTE — Progress Notes (Signed)
Pt received Stelara injection today, 1st dose. Given to the right lower abdomen.  Pt stayed 30 minutes post injection, no adverse reactions noted.  Vss, afebrile.  Pt d/c ambulatory to lobby.  D/c instruction on Stelara were given to pt and explained.

## 2015-08-31 MED FILL — Ustekinumab Soln Prefilled Syringe 45 MG/0.5ML: SUBCUTANEOUS | Qty: 0.5 | Status: AC

## 2015-09-16 DIAGNOSIS — E291 Testicular hypofunction: Secondary | ICD-10-CM | POA: Diagnosis not present

## 2015-09-26 ENCOUNTER — Encounter: Payer: Self-pay | Admitting: Cardiovascular Disease

## 2015-09-26 ENCOUNTER — Ambulatory Visit (INDEPENDENT_AMBULATORY_CARE_PROVIDER_SITE_OTHER): Payer: Medicare Other | Admitting: Cardiovascular Disease

## 2015-09-26 VITALS — BP 128/74 | HR 93 | Ht 67.0 in | Wt 221.4 lb

## 2015-09-26 DIAGNOSIS — G4733 Obstructive sleep apnea (adult) (pediatric): Secondary | ICD-10-CM

## 2015-09-26 DIAGNOSIS — Z9989 Dependence on other enabling machines and devices: Secondary | ICD-10-CM

## 2015-09-26 DIAGNOSIS — E785 Hyperlipidemia, unspecified: Secondary | ICD-10-CM

## 2015-09-26 DIAGNOSIS — I10 Essential (primary) hypertension: Secondary | ICD-10-CM

## 2015-09-26 NOTE — Patient Instructions (Signed)
Dr. Sallyanne Kuster recommends that you schedule a follow-up appointment in: ONE YEAR.  OUR GOAL IS FOR YOU TO HAVE LOST 29 LBS DURING THIS NEXT YEAR.  GOOD LUCK!!!

## 2015-09-26 NOTE — Progress Notes (Signed)
Patient ID: Austin Acosta, male   DOB: June 24, 1946, 69 y.o.   MRN: [id number]      Cardiology Office Note   Date:  09/26/2015   ID:  Austin Acosta, DOB 16-Apr-1946, MRN [id number]  PCP:  Mayra Neer, MD  Cardiologist:   Sanda Klein, MD   Chief Complaint  Patient presents with  . follow up    STRESS TEST RESULTS.  No complaints of chest pain, SOB or edema.  Mild dizziness while on Lisinopril      History of Present Illness: Austin Acosta is a 69 y.o. male who presents for  Follow-up after undergoing nuclear perfusion stress test. His simple treadmill stress test was abnormal. The nuclear study shows a normal pattern of myocardial perfusion except for mild diaphragmatic attenuation artifact. There was  Also reported mild diffuse hypokinesis with an ejection fraction of 51%. He does not have any clinical manifestations of congestive heart failure, denying exertional dyspnea or edema. His fatigue is substantially improved after he began treatment with testosterone for androgen deficiency. He seems to be expressing more interest in physical activity and improvement in his diet and weight reduction. He does not have angina pectoris.    Past Medical History  Diagnosis Date  . Arthritis   . Colon polyps   . Hyperlipidemia 06/11/2015  . Obstructive sleep apnea on CPAP 06/11/2015  . Obesity (BMI 30.0-34.9) 06/11/2015  . Essential hypertension 06/11/2015  . 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   Current Outpatient Prescriptions  Medication Sig Dispense Refill  . buPROPion (WELLBUTRIN XL) 150 MG 24 hr tablet Take 150 mg by mouth daily.    . Certolizumab Pegol (CIMZIA [place]) Inject into the skin. monthly    . losartan-hydrochlorothiazide (HYZAAR) 100-25 MG tablet Take 1 tablet by mouth daily.    Marland Kitchen testosterone cypionate (DEPOTESTOSTERONE CYPIONATE) 200 MG/ML injection every 30 (thirty) days.  0  . ustekinumab (STELARA) 45 MG/0.5ML SOSY  injection Inject 45 mg into the skin every 3 (three) months.    . venlafaxine XR (EFFEXOR-XR) 150 MG 24 hr capsule Take 1 capsule (150 mg total) by mouth daily with breakfast. 30 capsule 2   No current facility-administered medications for this visit.    Allergies:   Salmon and Latex    Social History:  The patient  reports that he quit smoking about 31 years ago. His smoking use included Cigarettes. He has a 30 pack-year smoking history. He does not have any smokeless tobacco history on file. He reports that he drinks about 0.5 - 1.0 oz of alcohol per week. He reports that he does not use illicit drugs.   Family History:  The patient's family history includes Coronary artery disease in his father; Depression in his brother; Heart disease in his mother; Paranoid behavior in his brother; Schizophrenia in his brother. There is no history of ADD / ADHD, Alcohol abuse, Anxiety disorder, Bipolar disorder, or Diabetes Mellitus II.    ROS:  Please see the history of present illness.    Otherwise, review of systems positive for  Improved mood and exercise tolerance.   All other systems are reviewed and negative.    PHYSICAL EXAM: VS:  BP 128/74 mmHg  Pulse 93  Ht 5\' 7"  (1.702 m)  Wt 221 lb 6.4 oz (100.426 kg)  BMI 34.67 kg/m2 , BMI Body mass index is 34.67 kg/(m^2).  General: Alert, oriented x3, no distress Head: no evidence of trauma, PERRL, EOMI, no exophtalmos  or lid lag, no myxedema, no xanthelasma; normal ears, nose and oropharynx Neck: normal jugular venous pulsations and no hepatojugular reflux; brisk carotid pulses without delay and no carotid bruits Chest: clear to auscultation, no signs of consolidation by percussion or palpation, normal fremitus, symmetrical and full respiratory excursions Cardiovascular: normal position and quality of the apical impulse, regular rhythm, normal first and second heart sounds, no murmurs, rubs or gallops Abdomen: no tenderness or distention, no masses  by palpation, no abnormal pulsatility or arterial bruits, normal bowel sounds, no hepatosplenomegaly Extremities: no clubbing, cyanosis or edema; 2+ radial, ulnar and brachial pulses bilaterally; 2+ right femoral, posterior tibial and dorsalis pedis pulses; 2+ left femoral, posterior tibial and dorsalis pedis pulses; no subclavian or femoral bruits Neurological: grossly nonfocal Psych: euthymic mood, full affect   EKG:  EKG is not ordered today.  Recent Labs: No results found for requested labs within last 365 days.    Lipid Panel No results found for: CHOL, TRIG, HDL, CHOLHDL, VLDL, LDLCALC, LDLDIRECT    Wt Readings from Last 3 Encounters:  09/26/15 221 lb 6.4 oz (100.426 kg)  08/30/15 218 lb (98.884 kg)  07/12/15 218 lb (98.884 kg)     ASSESSMENT AND PLAN:  1.  OSA -  His fatigue seems to have responded well to androgen supplements , but I still think it might be a good idea for his CPAP prescription to be reviewed by Dr. Claiborne Billings.  2.  Obesity-  We spent quite a while discussing importance of weight loss, calorie restriction, regular exercise and the importance of a reduction in carbohydrates and increasing protein and unsaturated fat. We set an initial target weight of 192 pounds which would bring him down to overweight rather than obese range. He will try to tackle this, with the help of his wife, over the next 12 months.  3.  Essential hypertension, well controlled  4.  Mild hyperlipidemia (LDL cholesterol in 100-130 range ) , I don't think pharmacological therapy is indicated at this point , but may be necessary if last all changes do not lead to improvement.  If vascular disease or diabetes mellitus develop, he should start a statin.  5.  Borderline LV dysfunction by nuclear scintigraphy. There is no clinical heart failure. We discussed the occasional artifactual readings with gated images. He is already taking an angiotensin receptor blocker. Beta blocker therapy might make his  fatigue worse and is not clearly indicated. Offered echocardiography to confirm the detected abnormality, but I don't think this would lead to any immediate change in treatment. Follow-up study in a year. Reevaluate sooner if she develops exertional dyspnea.    Current medicines are reviewed at length with the patient today.  The patient does not have concerns regarding medicines.  The following changes have been made:  no change  Labs/ tests ordered today include:  No orders of the defined types were placed in this encounter.     Patient Instructions  Dr. Sallyanne Kuster recommends that you schedule a follow-up appointment in: [place].  OUR GOAL IS FOR YOU TO HAVE LOST 29 LBS DURING THIS NEXT YEAR.  GOOD LUCK!!!       Mikael Spray, MD  09/26/2015 9:20 PM    Sanda Klein, MD, [facility] [phone] office [phone] pager

## 2015-09-27 ENCOUNTER — Encounter (HOSPITAL_COMMUNITY): Payer: Self-pay

## 2015-09-27 ENCOUNTER — Encounter (HOSPITAL_COMMUNITY)
Admission: RE | Admit: 2015-09-27 | Discharge: 2015-09-27 | Disposition: A | Discharge status: Home / Self Care | Payer: Medicare Other | Source: Ambulatory Visit | Attending: Internal Medicine | Admitting: Internal Medicine

## 2015-09-27 DIAGNOSIS — L4050 Arthropathic psoriasis, unspecified: Secondary | ICD-10-CM | POA: Diagnosis not present

## 2015-09-27 MED ORDER — USTEKINUMAB 45 MG/0.5ML ~~LOC~~ SOSY
45.0000 mg | PREFILLED_SYRINGE | Freq: Once | SUBCUTANEOUS | Status: AC
  Administered 2015-09-27: 45 mg via SUBCUTANEOUS
  Filled 2015-09-27: qty 0.5

## 2015-09-27 NOTE — Progress Notes (Signed)
Uneventful 2nd injection of STELARA RL Abdomen. Pt was discharged post injection since he had no problems with previous injection but will call Dr Amil Amen for questions or concerns. His next appt is scheduled as ordered for 12 weeks and is 12/20/15. Pt verbalized understanding of this

## 2015-10-19 DIAGNOSIS — E291 Testicular hypofunction: Secondary | ICD-10-CM | POA: Diagnosis not present

## 2015-11-02 DIAGNOSIS — L4050 Arthropathic psoriasis, unspecified: Secondary | ICD-10-CM | POA: Diagnosis not present

## 2015-11-02 DIAGNOSIS — L401 Generalized pustular psoriasis: Secondary | ICD-10-CM | POA: Diagnosis not present

## 2015-11-03 DIAGNOSIS — Z23 Encounter for immunization: Secondary | ICD-10-CM | POA: Diagnosis not present

## 2015-11-18 DIAGNOSIS — E291 Testicular hypofunction: Secondary | ICD-10-CM | POA: Diagnosis not present

## 2015-12-16 DIAGNOSIS — E291 Testicular hypofunction: Secondary | ICD-10-CM | POA: Diagnosis not present

## 2015-12-20 ENCOUNTER — Encounter (HOSPITAL_COMMUNITY)
Admission: RE | Admit: 2015-12-20 | Discharge: 2015-12-20 | Disposition: A | Discharge status: Home / Self Care | Payer: Medicare Other | Source: Ambulatory Visit | Attending: Internal Medicine | Admitting: Internal Medicine

## 2015-12-20 DIAGNOSIS — L4050 Arthropathic psoriasis, unspecified: Secondary | ICD-10-CM | POA: Diagnosis not present

## 2015-12-20 MED ORDER — USTEKINUMAB 45 MG/0.5ML ~~LOC~~ SOSY
45.0000 mg | PREFILLED_SYRINGE | Freq: Once | SUBCUTANEOUS | Status: AC
  Administered 2015-12-20: 45 mg via SUBCUTANEOUS
  Filled 2015-12-20: qty 0.5

## 2015-12-20 NOTE — Discharge Instructions (Signed)
STELARA Ustekinumab injection What is this medicine? USTEKINUMAB ([place] te KIN ue mab) is used to treat plaque psoriasis and psoriatic arthritis. It is not a cure. This medicine may be used for other purposes; ask your health care provider or pharmacist if you have questions. What should I tell my health care provider before I take this medicine? They need to know if you have any of these conditions: -cancer -diabetes -immune system problems -infection (especially a virus infection such as chickenpox, cold sores, or herpes)or history of infections -receiving or have received allergy shots -recently received or scheduled to receive a vaccine -tuberculosis, a positive skin test for tuberculosis, or have recently been in close contact with someone who has tuberculosis -an unusual reaction to ustekinumab, other medicines, foods, dyes, or preservatives -pregnant or trying to get pregnant -breast-feeding How should I use this medicine? This medicine is for injection under the skin. You will be taught how to prepare and give this medicine. Use exactly as directed. Take your medicine at regular intervals. Do not take your medicine more often than directed. It is important that you put your used needles and syringes in a special sharps container. Do not put them in a trash can. If you do not have a sharps container, call your pharmacist or healthcare provider to get one. A special MedGuide will be given to you by the pharmacist with each prescription and refill. Be sure to read this information carefully each time. Talk to your pediatrician regarding the use of this medicine in children. Special care may be needed. Overdosage: If you think you have taken too much of this medicine contact a poison control center or emergency room at once. NOTE: This medicine is only for you. Do not share this medicine with others. What if I miss a dose? If you miss a dose, take it as soon as you can. If it is almost time  for your next dose, take only that dose. Do not take double or extra doses. What may interact with this medicine? Do not take this medicine with any of the following medications: -live virus vaccines This medicine may also interact with the following medications: -cyclosporine -immunosuppressives -vaccines -warfarin This list may not describe all possible interactions. Give your health care provider a list of all the medicines, herbs, non-prescription drugs, or dietary supplements you use. Also tell them if you smoke, drink alcohol, or use illegal drugs. Some items may interact with your medicine. What should I watch for while using this medicine? Your condition will be monitored carefully while you are receiving this medicine. Tell your doctor or healthcare professional if your symptoms do not start to get better or if they get worse. You will be tested for tuberculosis (TB) before you start this medicine. If your doctor prescribes any medicine for TB, you should start taking the TB medicine before starting this medicine. Make sure to finish the full course of TB medicine. Call your doctor or health care professional if you get a cold or other infection while receiving this medicine. Do not treat yourself. This medicine may decrease your body's ability to fight infection. Talk to your doctor about your risk of cancer. You may be more at risk for certain types of cancers if you take this medicine. What side effects may I notice from receiving this medicine? Side effects that you should report to your doctor or health care professional as soon as possible: -allergic reactions like skin rash, itching or hives, swelling of the  face, lips, or tongue -breathing problems -changes in vision -confusion -fever, chills, or any other sign of infection -seizures -swollen lymph nodes in the neck, underarm, or groin areas -unexplained weight loss -unusually weak or tired Side effects that usually do not  require medical attention (Report these to your doctor or health care professional if they continue or are bothersome.): -headache -redness, itching, swelling, or bruising at site where injected This list may not describe all possible side effects. Call your doctor for medical advice about side effects. You may report side effects to FDA at 1-800-FDA-1088. Where should I keep my medicine? Keep out of the reach of children. If you are using this medicine at home, you will be instructed on how to store this medicine. Throw away any unused medicine after the expiration date on the label. NOTE: This sheet is a summary. It may not cover all possible information. If you have questions about this medicine, talk to your doctor, pharmacist, or health care provider.    2016, Elsevier/Gold Standard. (2012-11-04 16:31:45)

## 2016-01-16 DIAGNOSIS — E291 Testicular hypofunction: Secondary | ICD-10-CM | POA: Diagnosis not present

## 2016-02-01 DIAGNOSIS — M25562 Pain in left knee: Secondary | ICD-10-CM | POA: Diagnosis not present

## 2016-02-01 DIAGNOSIS — E291 Testicular hypofunction: Secondary | ICD-10-CM | POA: Diagnosis not present

## 2016-02-01 DIAGNOSIS — L4050 Arthropathic psoriasis, unspecified: Secondary | ICD-10-CM | POA: Diagnosis not present

## 2016-02-01 DIAGNOSIS — L401 Generalized pustular psoriasis: Secondary | ICD-10-CM | POA: Diagnosis not present

## 2016-02-22 DIAGNOSIS — I10 Essential (primary) hypertension: Secondary | ICD-10-CM | POA: Diagnosis not present

## 2016-02-22 DIAGNOSIS — E669 Obesity, unspecified: Secondary | ICD-10-CM | POA: Diagnosis not present

## 2016-02-22 DIAGNOSIS — G4733 Obstructive sleep apnea (adult) (pediatric): Secondary | ICD-10-CM | POA: Diagnosis not present

## 2016-02-22 DIAGNOSIS — Z6835 Body mass index (BMI) 35.0-35.9, adult: Secondary | ICD-10-CM | POA: Diagnosis not present

## 2016-03-07 DIAGNOSIS — E291 Testicular hypofunction: Secondary | ICD-10-CM | POA: Diagnosis not present

## 2016-03-08 DIAGNOSIS — G4733 Obstructive sleep apnea (adult) (pediatric): Secondary | ICD-10-CM | POA: Diagnosis not present

## 2016-03-12 DIAGNOSIS — H2513 Age-related nuclear cataract, bilateral: Secondary | ICD-10-CM | POA: Diagnosis not present

## 2016-03-12 DIAGNOSIS — H524 Presbyopia: Secondary | ICD-10-CM | POA: Diagnosis not present

## 2016-03-13 ENCOUNTER — Other Ambulatory Visit (HOSPITAL_COMMUNITY): Payer: Self-pay | Admitting: Internal Medicine

## 2016-03-13 ENCOUNTER — Ambulatory Visit (HOSPITAL_COMMUNITY)
Admission: RE | Admit: 2016-03-13 | Discharge: 2016-03-13 | Disposition: A | Discharge status: Home / Self Care | Payer: Medicare Other | Source: Ambulatory Visit | Attending: Internal Medicine | Admitting: Internal Medicine

## 2016-03-13 ENCOUNTER — Encounter (HOSPITAL_COMMUNITY): Payer: Self-pay

## 2016-03-13 DIAGNOSIS — L4050 Arthropathic psoriasis, unspecified: Secondary | ICD-10-CM | POA: Diagnosis not present

## 2016-03-13 MED ORDER — USTEKINUMAB 90 MG/ML ~~LOC~~ SOSY
90.0000 mg | PREFILLED_SYRINGE | SUBCUTANEOUS | Status: AC
  Administered 2016-03-13: 90 mg via SUBCUTANEOUS
  Filled 2016-03-13: qty 1

## 2016-03-13 NOTE — Discharge Instructions (Signed)
Ustekinumab injection °What is this medicine? °USTEKINUMAB (US te KIN ue mab) is used to treat plaque psoriasis and psoriatic arthritis. It is not a cure. °This medicine may be used for other purposes; ask your health care provider or pharmacist if you have questions. °What should I tell my health care provider before I take this medicine? °They need to know if you have any of these conditions: °-cancer °-diabetes °-immune system problems °-infection (especially a virus infection such as chickenpox, cold sores, or herpes)or history of infections °-receiving or have received allergy shots °-recently received or scheduled to receive a vaccine °-tuberculosis, a positive skin test for tuberculosis, or have recently been in close contact with someone who has tuberculosis °-an unusual reaction to ustekinumab, other medicines, foods, dyes, or preservatives °-pregnant or trying to get pregnant °-breast-feeding °How should I use this medicine? °This medicine is for injection under the skin. You will be taught how to prepare and give this medicine. Use exactly as directed. Take your medicine at regular intervals. Do not take your medicine more often than directed. °It is important that you put your used needles and syringes in a special sharps container. Do not put them in a trash can. If you do not have a sharps container, call your pharmacist or healthcare provider to get one. °A special MedGuide will be given to you by the pharmacist with each prescription and refill. Be sure to read this information carefully each time. °Talk to your pediatrician regarding the use of this medicine in children. Special care may be needed. °Overdosage: If you think you have taken too much of this medicine contact a poison control center or emergency room at once. °NOTE: This medicine is only for you. Do not share this medicine with others. °What if I miss a dose? °If you miss a dose, take it as soon as you can. If it is almost time for your  next dose, take only that dose. Do not take double or extra doses. °What may interact with this medicine? °Do not take this medicine with any of the following medications: °-live virus vaccines °This medicine may also interact with the following medications: °-cyclosporine °-immunosuppressives °-vaccines °-warfarin °This list may not describe all possible interactions. Give your health care provider a list of all the medicines, herbs, non-prescription drugs, or dietary supplements you use. Also tell them if you smoke, drink alcohol, or use illegal drugs. Some items may interact with your medicine. °What should I watch for while using this medicine? °Your condition will be monitored carefully while you are receiving this medicine. Tell your doctor or healthcare professional if your symptoms do not start to get better or if they get worse. °You will be tested for tuberculosis (TB) before you start this medicine. If your doctor prescribes any medicine for TB, you should start taking the TB medicine before starting this medicine. Make sure to finish the full course of TB medicine. °Call your doctor or health care professional if you get a cold or other infection while receiving this medicine. Do not treat yourself. This medicine may decrease your body's ability to fight infection. °Talk to your doctor about your risk of cancer. You may be more at risk for certain types of cancers if you take this medicine. °What side effects may I notice from receiving this medicine? °Side effects that you should report to your doctor or health care professional as soon as possible: °-allergic reactions like skin rash, itching or hives, swelling of the face,   lips, or tongue °-breathing problems °-changes in vision °-confusion °-fever, chills, or any other sign of infection °-seizures °-swollen lymph nodes in the neck, underarm, or groin areas °-unexplained weight loss °-unusually weak or tired °Side effects that usually do not require  medical attention (Report these to your doctor or health care professional if they continue or are bothersome.): °-headache °-redness, itching, swelling, or bruising at site where injected °This list may not describe all possible side effects. Call your doctor for medical advice about side effects. You may report side effects to FDA at 1-800-FDA-1088. °Where should I keep my medicine? °Keep out of the reach of children. °If you are using this medicine at home, you will be instructed on how to store this medicine. Throw away any unused medicine after the expiration date on the label. °NOTE: This sheet is a summary. It may not cover all possible information. If you have questions about this medicine, talk to your doctor, pharmacist, or health care provider. °  °© 2016, Elsevier/Gold Standard. (2012-11-04 16:31:45) ° °

## 2016-04-12 DIAGNOSIS — Z125 Encounter for screening for malignant neoplasm of prostate: Secondary | ICD-10-CM | POA: Diagnosis not present

## 2016-04-12 DIAGNOSIS — Z6835 Body mass index (BMI) 35.0-35.9, adult: Secondary | ICD-10-CM | POA: Diagnosis not present

## 2016-04-12 DIAGNOSIS — Z8249 Family history of ischemic heart disease and other diseases of the circulatory system: Secondary | ICD-10-CM | POA: Diagnosis not present

## 2016-04-12 DIAGNOSIS — E782 Mixed hyperlipidemia: Secondary | ICD-10-CM | POA: Diagnosis not present

## 2016-04-12 DIAGNOSIS — I10 Essential (primary) hypertension: Secondary | ICD-10-CM | POA: Diagnosis not present

## 2016-04-12 DIAGNOSIS — Z0000 Encounter for general adult medical examination without abnormal findings: Secondary | ICD-10-CM | POA: Diagnosis not present

## 2016-04-12 DIAGNOSIS — E669 Obesity, unspecified: Secondary | ICD-10-CM | POA: Diagnosis not present

## 2016-04-12 DIAGNOSIS — E291 Testicular hypofunction: Secondary | ICD-10-CM | POA: Diagnosis not present

## 2016-04-12 DIAGNOSIS — F322 Major depressive disorder, single episode, severe without psychotic features: Secondary | ICD-10-CM | POA: Diagnosis not present

## 2016-04-12 DIAGNOSIS — Z79899 Other long term (current) drug therapy: Secondary | ICD-10-CM | POA: Diagnosis not present

## 2016-04-12 DIAGNOSIS — R7309 Other abnormal glucose: Secondary | ICD-10-CM | POA: Diagnosis not present

## 2016-04-12 DIAGNOSIS — G4733 Obstructive sleep apnea (adult) (pediatric): Secondary | ICD-10-CM | POA: Diagnosis not present

## 2016-04-12 DIAGNOSIS — L4052 Psoriatic arthritis mutilans: Secondary | ICD-10-CM | POA: Diagnosis not present

## 2016-04-26 DIAGNOSIS — E291 Testicular hypofunction: Secondary | ICD-10-CM | POA: Diagnosis not present

## 2016-05-10 DIAGNOSIS — E291 Testicular hypofunction: Secondary | ICD-10-CM | POA: Diagnosis not present

## 2016-05-24 DIAGNOSIS — E291 Testicular hypofunction: Secondary | ICD-10-CM | POA: Diagnosis not present

## 2016-05-30 DIAGNOSIS — E291 Testicular hypofunction: Secondary | ICD-10-CM | POA: Diagnosis not present

## 2016-06-04 DIAGNOSIS — G4733 Obstructive sleep apnea (adult) (pediatric): Secondary | ICD-10-CM | POA: Diagnosis not present

## 2016-06-06 ENCOUNTER — Ambulatory Visit (HOSPITAL_COMMUNITY)
Admission: RE | Admit: 2016-06-06 | Discharge: 2016-06-06 | Disposition: A | Discharge status: Home / Self Care | Payer: Medicare Other | Source: Ambulatory Visit | Attending: Internal Medicine | Admitting: Internal Medicine

## 2016-06-06 ENCOUNTER — Encounter (HOSPITAL_COMMUNITY): Payer: Self-pay

## 2016-06-06 NOTE — Discharge Instructions (Signed)
Ustekinumab injection °What is this medicine? °USTEKINUMAB (US te KIN ue mab) is used to treat plaque psoriasis and psoriatic arthritis. It is not a cure. °This medicine may be used for other purposes; ask your health care provider or pharmacist if you have questions. °What should I tell my health care provider before I take this medicine? °They need to know if you have any of these conditions: °-cancer °-diabetes °-immune system problems °-infection (especially a virus infection such as chickenpox, cold sores, or herpes)or history of infections °-receiving or have received allergy shots °-recently received or scheduled to receive a vaccine °-tuberculosis, a positive skin test for tuberculosis, or have recently been in close contact with someone who has tuberculosis °-an unusual reaction to ustekinumab, other medicines, foods, dyes, or preservatives °-pregnant or trying to get pregnant °-breast-feeding °How should I use this medicine? °This medicine is for injection under the skin. You will be taught how to prepare and give this medicine. Use exactly as directed. Take your medicine at regular intervals. Do not take your medicine more often than directed. °It is important that you put your used needles and syringes in a special sharps container. Do not put them in a trash can. If you do not have a sharps container, call your pharmacist or healthcare provider to get one. °A special MedGuide will be given to you by the pharmacist with each prescription and refill. Be sure to read this information carefully each time. °Talk to your pediatrician regarding the use of this medicine in children. Special care may be needed. °Overdosage: If you think you have taken too much of this medicine contact a poison control center or emergency room at once. °NOTE: This medicine is only for you. Do not share this medicine with others. °What if I miss a dose? °If you miss a dose, take it as soon as you can. If it is almost time for your  next dose, take only that dose. Do not take double or extra doses. °What may interact with this medicine? °Do not take this medicine with any of the following medications: °-live virus vaccines °This medicine may also interact with the following medications: °-cyclosporine °-immunosuppressives °-vaccines °-warfarin °This list may not describe all possible interactions. Give your health care provider a list of all the medicines, herbs, non-prescription drugs, or dietary supplements you use. Also tell them if you smoke, drink alcohol, or use illegal drugs. Some items may interact with your medicine. °What should I watch for while using this medicine? °Your condition will be monitored carefully while you are receiving this medicine. Tell your doctor or healthcare professional if your symptoms do not start to get better or if they get worse. °You will be tested for tuberculosis (TB) before you start this medicine. If your doctor prescribes any medicine for TB, you should start taking the TB medicine before starting this medicine. Make sure to finish the full course of TB medicine. °Call your doctor or health care professional if you get a cold or other infection while receiving this medicine. Do not treat yourself. This medicine may decrease your body's ability to fight infection. °Talk to your doctor about your risk of cancer. You may be more at risk for certain types of cancers if you take this medicine. °What side effects may I notice from receiving this medicine? °Side effects that you should report to your doctor or health care professional as soon as possible: °-allergic reactions like skin rash, itching or hives, swelling of the face,   lips, or tongue °-breathing problems °-changes in vision °-confusion °-fever, chills, or any other sign of infection °-seizures °-swollen lymph nodes in the neck, underarm, or groin areas °-unexplained weight loss °-unusually weak or tired °Side effects that usually do not require  medical attention (Report these to your doctor or health care professional if they continue or are bothersome.): °-headache °-redness, itching, swelling, or bruising at site where injected °This list may not describe all possible side effects. Call your doctor for medical advice about side effects. You may report side effects to FDA at 1-800-FDA-1088. °Where should I keep my medicine? °Keep out of the reach of children. °If you are using this medicine at home, you will be instructed on how to store this medicine. Throw away any unused medicine after the expiration date on the label. °NOTE: This sheet is a summary. It may not cover all possible information. If you have questions about this medicine, talk to your doctor, pharmacist, or health care provider. °  °© 2016, Elsevier/Gold Standard. (2012-11-04 16:31:45) ° °

## 2016-06-06 NOTE — Progress Notes (Signed)
Pt. Requesting to receive 45 mg instead of 90 mg, pt. Stated " I had more allergies and stuff with it, and I prefer to get the 45 mg", RN attempted to call office multiple times, answering machine still on, pt. Rescheduled for next week.

## 2016-06-08 DIAGNOSIS — E291 Testicular hypofunction: Secondary | ICD-10-CM | POA: Diagnosis not present

## 2016-06-13 ENCOUNTER — Ambulatory Visit (HOSPITAL_COMMUNITY): Admission: RE | Admit: 2016-06-13 | Payer: Medicare Other | Source: Ambulatory Visit

## 2016-06-13 DIAGNOSIS — L401 Generalized pustular psoriasis: Secondary | ICD-10-CM | POA: Diagnosis not present

## 2016-06-13 DIAGNOSIS — L4050 Arthropathic psoriasis, unspecified: Secondary | ICD-10-CM | POA: Diagnosis not present

## 2016-06-22 DIAGNOSIS — E291 Testicular hypofunction: Secondary | ICD-10-CM | POA: Diagnosis not present

## 2016-07-06 DIAGNOSIS — E291 Testicular hypofunction: Secondary | ICD-10-CM | POA: Diagnosis not present

## 2016-07-20 DIAGNOSIS — E291 Testicular hypofunction: Secondary | ICD-10-CM | POA: Diagnosis not present

## 2016-08-03 DIAGNOSIS — E291 Testicular hypofunction: Secondary | ICD-10-CM | POA: Diagnosis not present

## 2016-08-13 DIAGNOSIS — G4733 Obstructive sleep apnea (adult) (pediatric): Secondary | ICD-10-CM | POA: Diagnosis not present

## 2016-08-13 DIAGNOSIS — E291 Testicular hypofunction: Secondary | ICD-10-CM | POA: Diagnosis not present

## 2016-08-13 DIAGNOSIS — L4052 Psoriatic arthritis mutilans: Secondary | ICD-10-CM | POA: Diagnosis not present

## 2016-08-13 DIAGNOSIS — I10 Essential (primary) hypertension: Secondary | ICD-10-CM | POA: Diagnosis not present

## 2016-08-13 DIAGNOSIS — F322 Major depressive disorder, single episode, severe without psychotic features: Secondary | ICD-10-CM | POA: Diagnosis not present

## 2016-08-13 DIAGNOSIS — Z79899 Other long term (current) drug therapy: Secondary | ICD-10-CM | POA: Diagnosis not present

## 2016-08-13 DIAGNOSIS — E669 Obesity, unspecified: Secondary | ICD-10-CM | POA: Diagnosis not present

## 2016-08-13 DIAGNOSIS — N529 Male erectile dysfunction, unspecified: Secondary | ICD-10-CM | POA: Diagnosis not present

## 2016-08-13 DIAGNOSIS — R7309 Other abnormal glucose: Secondary | ICD-10-CM | POA: Diagnosis not present

## 2016-08-24 DIAGNOSIS — N179 Acute kidney failure, unspecified: Secondary | ICD-10-CM | POA: Diagnosis not present

## 2016-08-24 DIAGNOSIS — E291 Testicular hypofunction: Secondary | ICD-10-CM | POA: Diagnosis not present

## 2016-08-28 ENCOUNTER — Ambulatory Visit (HOSPITAL_COMMUNITY): Payer: Self-pay

## 2016-09-05 ENCOUNTER — Ambulatory Visit (HOSPITAL_COMMUNITY): Admission: RE | Admit: 2016-09-05 | Payer: Medicare Other | Source: Ambulatory Visit | Admitting: Internal Medicine

## 2016-09-06 DIAGNOSIS — L4050 Arthropathic psoriasis, unspecified: Secondary | ICD-10-CM | POA: Diagnosis not present

## 2016-09-07 DIAGNOSIS — E291 Testicular hypofunction: Secondary | ICD-10-CM | POA: Diagnosis not present

## 2016-09-07 DIAGNOSIS — I10 Essential (primary) hypertension: Secondary | ICD-10-CM | POA: Diagnosis not present

## 2016-09-07 DIAGNOSIS — N179 Acute kidney failure, unspecified: Secondary | ICD-10-CM | POA: Diagnosis not present

## 2016-09-12 ENCOUNTER — Encounter: Payer: Self-pay | Admitting: Skilled Nursing Facility1

## 2016-09-12 ENCOUNTER — Encounter: Payer: Medicare Other | Attending: Family Medicine | Admitting: Skilled Nursing Facility1

## 2016-09-12 DIAGNOSIS — E119 Type 2 diabetes mellitus without complications: Secondary | ICD-10-CM | POA: Diagnosis not present

## 2016-09-12 DIAGNOSIS — Z713 Dietary counseling and surveillance: Secondary | ICD-10-CM | POA: Insufficient documentation

## 2016-09-12 NOTE — Patient Instructions (Signed)
First Meal:  Cheerios + bluberries + milk + nuts or egg  Second Meal:  1 apple + tuna fish sandwich + tomato on the side  Third meal:  Chicken + Lima beans + Rice + Green Beans

## 2016-09-12 NOTE — Progress Notes (Signed)
Diabetes Self-Management Education  Visit Type: First/Initial  Appt. Start Time: 9:00 Appt. End Time: 10:00  09/12/2016  [name], identified by name and date of birth, is a 70 y.o. male with a diagnosis of Diabetes: Type 2.   ASSESSMENT  Height 5\' 7"  (1.702 m), weight 229 lb 8 oz (104.1 kg). Body mass index is 35.94 kg/m. Pt states after sudden bought's of allergic reactions he stopped going outside. Pt seems to have some animosity towards his wife. Pt states he eats out 2-3 times a week.      Diabetes Self-Management Education - 09/12/16 0911      Visit Information   Visit Type First/Initial     Initial Visit   Diabetes Type Type 2   Are you currently following a meal plan? No   Are you taking your medications as prescribed? Not on Medications   Date Diagnosed This year     Health Coping   How would you rate your overall health? Good     Psychosocial Assessment   Patient Belief/Attitude about Diabetes Denial   Self-management support Family   Other persons present Spouse/SO     Pre-Education Assessment   Patient understands the diabetes disease and treatment process. Needs Instruction   Patient understands incorporating nutritional management into lifestyle. Needs Instruction   Patient undertands incorporating physical activity into lifestyle. Needs Instruction   Patient understands using medications safely. Needs Instruction   Patient understands monitoring blood glucose, interpreting and using results Needs Instruction   Patient understands prevention, detection, and treatment of acute complications. Needs Instruction   Patient understands prevention, detection, and treatment of chronic complications. Needs Instruction   Patient understands how to develop strategies to address psychosocial issues. Needs Instruction   Patient understands how to develop strategies to promote health/change behavior. Needs Instruction     Complications   Last HgB A1C per  patient/outside source 6.9 %   How often do you check your blood sugar? 0 times/day (not testing)   Have you had a dilated eye exam in the past 12 months? Yes   Have you had a dental exam in the past 12 months? No   Are you checking your feet? No     Dietary Intake   Breakfast cereal------couple eggs   Snack (morning) peanuts   Lunch 2 sandwiches   Snack (afternoon) crackers   Dinner meat and green vegetable   Snack (evening) ice cream   Beverage(s) crstal light lemonade and water     Exercise   Exercise Type ADL's     Patient Education   Previous Diabetes Education No   Disease state  Definition of diabetes, type 1 and 2, and the diagnosis of diabetes;Factors that contribute to the development of diabetes   Nutrition management  Role of diet in the treatment of diabetes and the relationship between the three main macronutrients and blood glucose level;Food label reading, portion sizes and measuring food.;Carbohydrate counting;Information on hints to eating out and maintain blood glucose control.   Physical activity and exercise  Role of exercise on diabetes management, blood pressure control and cardiac health.   Monitoring Purpose and frequency of SMBG.;Yearly dilated eye exam;Daily foot exams;Identified appropriate SMBG and/or A1C goals.   Chronic complications Dental care;Lipid levels, blood glucose control and heart disease;Retinopathy and reason for yearly dilated eye exams;Reviewed with patient heart disease, higher risk of, and prevention;Assessed and discussed foot care and prevention of foot problems     Post-Education Assessment   Patient understands  the diabetes disease and treatment process. Demonstrates understanding / competency   Patient understands incorporating nutritional management into lifestyle. Demonstrates understanding / competency   Patient undertands incorporating physical activity into lifestyle. Demonstrates understanding / competency   Patient understands  using medications safely. Demonstrates understanding / competency   Patient understands monitoring blood glucose, interpreting and using results Demonstrates understanding / competency   Patient understands prevention, detection, and treatment of acute complications. Demonstrates understanding / competency   Patient understands prevention, detection, and treatment of chronic complications. Demonstrates understanding / competency   Patient understands how to develop strategies to address psychosocial issues. Demonstrates understanding / competency   Patient understands how to develop strategies to promote health/change behavior. Demonstrates understanding / competency     Outcomes   Expected Outcomes Demonstrated interest in learning. Expect positive outcomes   Future DMSE PRN   Program Status Completed      Individualized Plan for Diabetes Self-Management Training:   Learning Objective:  Patient will have a greater understanding of diabetes self-management. Patient education plan is to attend individual and/or group sessions per assessed needs and concerns.   Plan:   Patient Instructions  First Meal:  Cheerios + bluberries + milk + nuts or egg  Second Meal:  1 apple + tuna fish sandwich + tomato on the side  Third meal:  Chicken + Lima beans + Rice + Green Beans   Expected Outcomes:  Demonstrated interest in learning. Expect positive outcomes  Education material provided: Living Well with Diabetes, Food label handouts, Meal plan card, My Plate and Snack sheet  If problems or questions, patient to contact team via:  Phone  Future DSME appointment: PRN

## 2016-09-21 DIAGNOSIS — E291 Testicular hypofunction: Secondary | ICD-10-CM | POA: Diagnosis not present

## 2016-09-21 DIAGNOSIS — N179 Acute kidney failure, unspecified: Secondary | ICD-10-CM | POA: Diagnosis not present

## 2016-10-05 DIAGNOSIS — E291 Testicular hypofunction: Secondary | ICD-10-CM | POA: Diagnosis not present

## 2016-10-05 DIAGNOSIS — N179 Acute kidney failure, unspecified: Secondary | ICD-10-CM | POA: Diagnosis not present

## 2016-10-05 DIAGNOSIS — N183 Chronic kidney disease, stage 3 (moderate): Secondary | ICD-10-CM | POA: Diagnosis not present

## 2016-10-15 DIAGNOSIS — L4050 Arthropathic psoriasis, unspecified: Secondary | ICD-10-CM | POA: Diagnosis not present

## 2016-10-15 DIAGNOSIS — L401 Generalized pustular psoriasis: Secondary | ICD-10-CM | POA: Diagnosis not present

## 2016-10-19 DIAGNOSIS — N179 Acute kidney failure, unspecified: Secondary | ICD-10-CM | POA: Diagnosis not present

## 2016-10-24 DIAGNOSIS — L4050 Arthropathic psoriasis, unspecified: Secondary | ICD-10-CM | POA: Diagnosis not present

## 2016-11-09 DIAGNOSIS — Z79899 Other long term (current) drug therapy: Secondary | ICD-10-CM | POA: Diagnosis not present

## 2016-11-09 DIAGNOSIS — E1122 Type 2 diabetes mellitus with diabetic chronic kidney disease: Secondary | ICD-10-CM | POA: Diagnosis not present

## 2016-11-09 DIAGNOSIS — L4052 Psoriatic arthritis mutilans: Secondary | ICD-10-CM | POA: Diagnosis not present

## 2016-11-09 DIAGNOSIS — I10 Essential (primary) hypertension: Secondary | ICD-10-CM | POA: Diagnosis not present

## 2016-11-09 DIAGNOSIS — N183 Chronic kidney disease, stage 3 (moderate): Secondary | ICD-10-CM | POA: Diagnosis not present

## 2016-11-09 DIAGNOSIS — E291 Testicular hypofunction: Secondary | ICD-10-CM | POA: Diagnosis not present

## 2016-11-09 DIAGNOSIS — E669 Obesity, unspecified: Secondary | ICD-10-CM | POA: Diagnosis not present

## 2016-11-09 DIAGNOSIS — E782 Mixed hyperlipidemia: Secondary | ICD-10-CM | POA: Diagnosis not present

## 2016-11-28 DIAGNOSIS — L4050 Arthropathic psoriasis, unspecified: Secondary | ICD-10-CM | POA: Diagnosis not present

## 2016-11-29 ENCOUNTER — Ambulatory Visit (HOSPITAL_COMMUNITY): Payer: Self-pay

## 2016-12-25 DIAGNOSIS — H2513 Age-related nuclear cataract, bilateral: Secondary | ICD-10-CM | POA: Diagnosis not present

## 2016-12-25 DIAGNOSIS — H3500 Unspecified background retinopathy: Secondary | ICD-10-CM | POA: Diagnosis not present

## 2016-12-25 DIAGNOSIS — H524 Presbyopia: Secondary | ICD-10-CM | POA: Diagnosis not present

## 2016-12-25 DIAGNOSIS — H40053 Ocular hypertension, bilateral: Secondary | ICD-10-CM | POA: Diagnosis not present

## 2017-01-03 DIAGNOSIS — H25811 Combined forms of age-related cataract, right eye: Secondary | ICD-10-CM | POA: Diagnosis not present

## 2017-01-03 DIAGNOSIS — H2511 Age-related nuclear cataract, right eye: Secondary | ICD-10-CM | POA: Diagnosis not present

## 2017-01-14 DIAGNOSIS — Z6833 Body mass index (BMI) 33.0-33.9, adult: Secondary | ICD-10-CM | POA: Diagnosis not present

## 2017-01-14 DIAGNOSIS — E669 Obesity, unspecified: Secondary | ICD-10-CM | POA: Diagnosis not present

## 2017-01-14 DIAGNOSIS — L4050 Arthropathic psoriasis, unspecified: Secondary | ICD-10-CM | POA: Diagnosis not present

## 2017-01-14 DIAGNOSIS — L401 Generalized pustular psoriasis: Secondary | ICD-10-CM | POA: Diagnosis not present

## 2017-01-23 DIAGNOSIS — L4050 Arthropathic psoriasis, unspecified: Secondary | ICD-10-CM | POA: Diagnosis not present

## 2017-01-24 DIAGNOSIS — H25812 Combined forms of age-related cataract, left eye: Secondary | ICD-10-CM | POA: Diagnosis not present

## 2017-01-24 DIAGNOSIS — H2512 Age-related nuclear cataract, left eye: Secondary | ICD-10-CM | POA: Diagnosis not present

## 2017-03-20 DIAGNOSIS — L4050 Arthropathic psoriasis, unspecified: Secondary | ICD-10-CM | POA: Diagnosis not present

## 2017-05-15 DIAGNOSIS — Z79899 Other long term (current) drug therapy: Secondary | ICD-10-CM | POA: Diagnosis not present

## 2017-05-15 DIAGNOSIS — L4050 Arthropathic psoriasis, unspecified: Secondary | ICD-10-CM | POA: Diagnosis not present

## 2017-05-24 DIAGNOSIS — E1122 Type 2 diabetes mellitus with diabetic chronic kidney disease: Secondary | ICD-10-CM | POA: Diagnosis not present

## 2017-05-24 DIAGNOSIS — E291 Testicular hypofunction: Secondary | ICD-10-CM | POA: Diagnosis not present

## 2017-05-24 DIAGNOSIS — E782 Mixed hyperlipidemia: Secondary | ICD-10-CM | POA: Diagnosis not present

## 2017-05-24 DIAGNOSIS — R740 Nonspecific elevation of levels of transaminase and lactic acid dehydrogenase [LDH]: Secondary | ICD-10-CM | POA: Diagnosis not present

## 2017-05-24 DIAGNOSIS — E669 Obesity, unspecified: Secondary | ICD-10-CM | POA: Diagnosis not present

## 2017-05-24 DIAGNOSIS — I10 Essential (primary) hypertension: Secondary | ICD-10-CM | POA: Diagnosis not present

## 2017-05-24 DIAGNOSIS — G4733 Obstructive sleep apnea (adult) (pediatric): Secondary | ICD-10-CM | POA: Diagnosis not present

## 2017-05-24 DIAGNOSIS — F322 Major depressive disorder, single episode, severe without psychotic features: Secondary | ICD-10-CM | POA: Diagnosis not present

## 2017-05-24 DIAGNOSIS — N183 Chronic kidney disease, stage 3 (moderate): Secondary | ICD-10-CM | POA: Diagnosis not present

## 2017-05-24 DIAGNOSIS — Z0000 Encounter for general adult medical examination without abnormal findings: Secondary | ICD-10-CM | POA: Diagnosis not present

## 2017-05-24 DIAGNOSIS — Z125 Encounter for screening for malignant neoplasm of prostate: Secondary | ICD-10-CM | POA: Diagnosis not present

## 2017-05-24 DIAGNOSIS — L4052 Psoriatic arthritis mutilans: Secondary | ICD-10-CM | POA: Diagnosis not present

## 2017-06-06 DIAGNOSIS — G4733 Obstructive sleep apnea (adult) (pediatric): Secondary | ICD-10-CM | POA: Diagnosis not present

## 2017-06-10 DIAGNOSIS — R945 Abnormal results of liver function studies: Secondary | ICD-10-CM | POA: Diagnosis not present

## 2017-06-11 DIAGNOSIS — E113293 Type 2 diabetes mellitus with mild nonproliferative diabetic retinopathy without macular edema, bilateral: Secondary | ICD-10-CM | POA: Diagnosis not present

## 2017-06-11 DIAGNOSIS — H43813 Vitreous degeneration, bilateral: Secondary | ICD-10-CM | POA: Diagnosis not present

## 2017-06-27 DIAGNOSIS — R945 Abnormal results of liver function studies: Secondary | ICD-10-CM | POA: Diagnosis not present

## 2017-07-18 DIAGNOSIS — L401 Generalized pustular psoriasis: Secondary | ICD-10-CM | POA: Diagnosis not present

## 2017-07-18 DIAGNOSIS — Z6834 Body mass index (BMI) 34.0-34.9, adult: Secondary | ICD-10-CM | POA: Diagnosis not present

## 2017-07-18 DIAGNOSIS — L4050 Arthropathic psoriasis, unspecified: Secondary | ICD-10-CM | POA: Diagnosis not present

## 2017-07-18 DIAGNOSIS — E669 Obesity, unspecified: Secondary | ICD-10-CM | POA: Diagnosis not present

## 2017-07-30 DIAGNOSIS — L4050 Arthropathic psoriasis, unspecified: Secondary | ICD-10-CM | POA: Diagnosis not present

## 2017-07-30 DIAGNOSIS — Z79899 Other long term (current) drug therapy: Secondary | ICD-10-CM | POA: Diagnosis not present

## 2017-08-06 DIAGNOSIS — R945 Abnormal results of liver function studies: Secondary | ICD-10-CM | POA: Diagnosis not present

## 2017-08-13 DIAGNOSIS — L4050 Arthropathic psoriasis, unspecified: Secondary | ICD-10-CM | POA: Diagnosis not present

## 2017-08-27 DIAGNOSIS — L4050 Arthropathic psoriasis, unspecified: Secondary | ICD-10-CM | POA: Diagnosis not present

## 2017-09-17 DIAGNOSIS — Z6833 Body mass index (BMI) 33.0-33.9, adult: Secondary | ICD-10-CM | POA: Diagnosis not present

## 2017-09-17 DIAGNOSIS — E669 Obesity, unspecified: Secondary | ICD-10-CM | POA: Diagnosis not present

## 2017-09-17 DIAGNOSIS — R945 Abnormal results of liver function studies: Secondary | ICD-10-CM | POA: Diagnosis not present

## 2017-09-17 DIAGNOSIS — L401 Generalized pustular psoriasis: Secondary | ICD-10-CM | POA: Diagnosis not present

## 2017-09-17 DIAGNOSIS — L4050 Arthropathic psoriasis, unspecified: Secondary | ICD-10-CM | POA: Diagnosis not present

## 2017-09-25 DIAGNOSIS — L4050 Arthropathic psoriasis, unspecified: Secondary | ICD-10-CM | POA: Diagnosis not present

## 2017-09-30 DIAGNOSIS — Z23 Encounter for immunization: Secondary | ICD-10-CM | POA: Diagnosis not present

## 2017-10-23 DIAGNOSIS — L4050 Arthropathic psoriasis, unspecified: Secondary | ICD-10-CM | POA: Diagnosis not present

## 2017-12-09 DIAGNOSIS — E782 Mixed hyperlipidemia: Secondary | ICD-10-CM | POA: Diagnosis not present

## 2017-12-09 DIAGNOSIS — E1122 Type 2 diabetes mellitus with diabetic chronic kidney disease: Secondary | ICD-10-CM | POA: Diagnosis not present

## 2017-12-09 DIAGNOSIS — Z6835 Body mass index (BMI) 35.0-35.9, adult: Secondary | ICD-10-CM | POA: Diagnosis not present

## 2017-12-09 DIAGNOSIS — N183 Chronic kidney disease, stage 3 (moderate): Secondary | ICD-10-CM | POA: Diagnosis not present

## 2017-12-09 DIAGNOSIS — F322 Major depressive disorder, single episode, severe without psychotic features: Secondary | ICD-10-CM | POA: Diagnosis not present

## 2017-12-09 DIAGNOSIS — E669 Obesity, unspecified: Secondary | ICD-10-CM | POA: Diagnosis not present

## 2017-12-09 DIAGNOSIS — I10 Essential (primary) hypertension: Secondary | ICD-10-CM | POA: Diagnosis not present

## 2017-12-19 DIAGNOSIS — L4050 Arthropathic psoriasis, unspecified: Secondary | ICD-10-CM | POA: Diagnosis not present

## 2017-12-19 DIAGNOSIS — R945 Abnormal results of liver function studies: Secondary | ICD-10-CM | POA: Diagnosis not present

## 2017-12-19 DIAGNOSIS — L401 Generalized pustular psoriasis: Secondary | ICD-10-CM | POA: Diagnosis not present

## 2017-12-19 DIAGNOSIS — Z6834 Body mass index (BMI) 34.0-34.9, adult: Secondary | ICD-10-CM | POA: Diagnosis not present

## 2017-12-19 DIAGNOSIS — E669 Obesity, unspecified: Secondary | ICD-10-CM | POA: Diagnosis not present

## 2017-12-27 DIAGNOSIS — R945 Abnormal results of liver function studies: Secondary | ICD-10-CM | POA: Diagnosis not present

## 2018-01-15 DIAGNOSIS — L4050 Arthropathic psoriasis, unspecified: Secondary | ICD-10-CM | POA: Diagnosis not present

## 2018-01-30 DIAGNOSIS — R740 Nonspecific elevation of levels of transaminase and lactic acid dehydrogenase [LDH]: Secondary | ICD-10-CM | POA: Diagnosis not present

## 2018-03-03 DIAGNOSIS — H52203 Unspecified astigmatism, bilateral: Secondary | ICD-10-CM | POA: Diagnosis not present

## 2018-03-03 DIAGNOSIS — E113291 Type 2 diabetes mellitus with mild nonproliferative diabetic retinopathy without macular edema, right eye: Secondary | ICD-10-CM | POA: Diagnosis not present

## 2018-04-14 DIAGNOSIS — L4050 Arthropathic psoriasis, unspecified: Secondary | ICD-10-CM | POA: Diagnosis not present

## 2018-06-06 DIAGNOSIS — G4733 Obstructive sleep apnea (adult) (pediatric): Secondary | ICD-10-CM | POA: Diagnosis not present

## 2018-06-06 DIAGNOSIS — I10 Essential (primary) hypertension: Secondary | ICD-10-CM | POA: Diagnosis not present

## 2018-06-06 DIAGNOSIS — E1122 Type 2 diabetes mellitus with diabetic chronic kidney disease: Secondary | ICD-10-CM | POA: Diagnosis not present

## 2018-06-06 DIAGNOSIS — Z0000 Encounter for general adult medical examination without abnormal findings: Secondary | ICD-10-CM | POA: Diagnosis not present

## 2018-06-06 DIAGNOSIS — Z125 Encounter for screening for malignant neoplasm of prostate: Secondary | ICD-10-CM | POA: Diagnosis not present

## 2018-06-06 DIAGNOSIS — F322 Major depressive disorder, single episode, severe without psychotic features: Secondary | ICD-10-CM | POA: Diagnosis not present

## 2018-06-06 DIAGNOSIS — N183 Chronic kidney disease, stage 3 (moderate): Secondary | ICD-10-CM | POA: Diagnosis not present

## 2018-06-06 DIAGNOSIS — E291 Testicular hypofunction: Secondary | ICD-10-CM | POA: Diagnosis not present

## 2018-06-06 DIAGNOSIS — E669 Obesity, unspecified: Secondary | ICD-10-CM | POA: Diagnosis not present

## 2018-06-06 DIAGNOSIS — M25512 Pain in left shoulder: Secondary | ICD-10-CM | POA: Diagnosis not present

## 2018-06-06 DIAGNOSIS — L4052 Psoriatic arthritis mutilans: Secondary | ICD-10-CM | POA: Diagnosis not present

## 2018-06-06 DIAGNOSIS — E782 Mixed hyperlipidemia: Secondary | ICD-10-CM | POA: Diagnosis not present

## 2018-06-09 ENCOUNTER — Encounter: Payer: Self-pay | Admitting: Sports Medicine

## 2018-06-09 ENCOUNTER — Ambulatory Visit: Payer: Self-pay

## 2018-06-09 ENCOUNTER — Ambulatory Visit (INDEPENDENT_AMBULATORY_CARE_PROVIDER_SITE_OTHER): Payer: Medicare Other | Admitting: Sports Medicine

## 2018-06-09 VITALS — BP 150/90 | HR 79 | Ht 67.0 in | Wt 222.6 lb

## 2018-06-09 DIAGNOSIS — M25512 Pain in left shoulder: Secondary | ICD-10-CM

## 2018-06-09 DIAGNOSIS — L4052 Psoriatic arthritis mutilans: Secondary | ICD-10-CM

## 2018-06-09 NOTE — Patient Instructions (Addendum)

## 2018-06-09 NOTE — Progress Notes (Signed)
Austin Acosta. Austin Acosta, [place] at [facility] [phone]  Austin Acosta - 72 y.o. male MRN [id number]  Date of birth: 09-21-1946  Visit Date: 06/09/2018  PCP: Mayra Neer, MD   Referred by: Mayra Neer, MD  Scribe(s) for today's visit: Josepha Pigg, CMA  SUBJECTIVE:  Austin Acosta is here for No chief complaint on file.   His L shoulder pain symptoms INITIALLY: Began about 10 years ago and has been getting progressively worse over the past 2-3 years. MOI is unknown.  Described as moderate-severe aching, non-radiating. He c/o neck pain.  Worsened with reaching overhead Improved with rest Additional associated symptoms include: He has dx of psoriatic arthritis. He has noticed some clicking and popping around the shoulder. He denies pain when reaching across his body or down and back. He denies swelling around the shoulder, n/t/w. He reports decreased grip strength but feels that this is d/t his arthritis.    At this time symptoms are worsening compared to onset, occurring more often.  He has been stretching, trying to loosen. He has been taking IBU with good relief. He has tried using Activon (topical medication for arthritis with some relief.   No recent XR L shoulder or C-spine.  He had MRI 10 yrs ago which showed bulging disk (done in [place]).    REVIEW OF SYSTEMS: Reports night time disturbances. Denies fevers, chills, or night sweats. Denies unexplained weight loss. Denies personal history of cancer. Denies changes in bowel or bladder habits. Denies recent unreported falls. Denies new or worsening dyspnea or wheezing. Reports headaches when neck pain is more severe.  Denies numbness, tingling or weakness  In the extremities.  Denies dizziness or presyncopal episodes Denies lower extremity edema    HISTORY:  Prior history reviewed and updated per electronic medical record.  Social History   Occupational History    . Not on file  Tobacco Use  . Smoking status: Former Smoker    Packs/day: 1.50    Years: 20.00    Pack years: 30.00    Types: Cigarettes    Last attempt to quit: 01/03/1984    Years since quitting: 34.6  . Smokeless tobacco: Never Used  Substance and Sexual Activity  . Alcohol use: Yes    Alcohol/week: 1.0 - 2.0 standard drinks    Types: 1 - 2 Standard drinks or equivalent per week  . Drug use: No  . Sexual activity: Not on file   Social History   Social History Narrative  . Not on file     DATA OBTAINED & REVIEWED:  No results for input(s): HGBA1C, LABURIC, CREATINE in the last 8760 hours. . See above .   OBJECTIVE:  VS:  HT:5\' 7"  (170.2 cm)   WT:222 lb 9.6 oz (101 kg)  BMI:34.86    BP:(!) 150/90  HR:79bpm  TEMP: ( )  RESP:94 %   PHYSICAL EXAM: CONSTITUTIONAL: Well-developed, Well-nourished and In no acute distress PSYCHIATRIC: Alert & appropriately interactive. and Not depressed or anxious appearing. RESPIRATORY: No increased work of breathing and Trachea Midline EYES: Pupils are equal., EOM intact without nystagmus. and No scleral icterus.  VASCULAR EXAM: Warm and well perfused NEURO: unremarkable  MSK Exam: Left Shoulder Exam: Well aligned, no significant deformity. No overlying skin changes. Neck: normal range of motion and supple Non tender to palpation over: Bony Landmarks Axial loading produces: Mild pain and Mild crepitation Drop arm test: negative  Internal Rotation: Significantly limited. with moderate  pain.   Strength: 4/5 External Rotation: Significantly limited with moderate pain.   Strength: 4/5  Hawkins: positive, moderate pain Neers: positive, moderate pain  Empty Can: positive, mild pain Strength: 4/5 Speed's: positive, moderate pain Strength: 4/5 O'Briens: positive, moderate pain Strength: 4/5   ASSESSMENT   1. Left shoulder pain, unspecified chronicity   2. Psoriatic arthritis mutilans [facility])     PLAN:  Pertinent additional  documentation may be included in corresponding procedure notes, imaging studies, problem based documentation and patient instructions.  Procedures:  . US Guided Injection per procedure note  Medications:  No orders of the defined types were placed in this encounter.  Discussion/Instructions: No problem-specific Assessment & Plan notes found for this encounter.  .   . Symptoms most consistent with underlying mild arthritis . Discussed red flag symptoms that warrant earlier emergent evaluation and patient voices understanding. . Activity modifications and the importance of avoiding exacerbating activities (limiting pain to no more than a 4 / 10 during or following activity) recommended and discussed.  Follow-up:  . Return if symptoms worsen or fail to improve.  . If any lack of improvement consider: . further diagnostic evaluation with Further diagnostic imaging including plain film x-rays.     CMA/ATC served as Education administrator during this visit. History, Physical, and Plan performed by medical provider. Documentation and orders reviewed and attested to.      Gerda Diss, [place] [facility] Physician

## 2018-06-09 NOTE — Procedures (Addendum)
PROCEDURE NOTE:  Ultrasound Guided: Injection: Left shoulder Images were obtained and interpreted by myself, Teresa Coombs, DO  Images have been saved and stored to PACS system. Images obtained on: GE S7 Ultrasound machine    ULTRASOUND FINDINGS:  Mild degenerative changes, questionable small effusion  DESCRIPTION OF PROCEDURE:  The patient's clinical condition is marked by substantial pain and/or significant functional disability. Other conservative therapy has not provided relief, is contraindicated, or not appropriate. There is a reasonable likelihood that injection will significantly improve the patient's pain and/or functional impairment.   After discussing the risks, benefits and expected outcomes of the injection and all questions were reviewed and answered, the patient wished to undergo the above named procedure.  Verbal consent was obtained.  The ultrasound was used to identify the target structure and adjacent neurovascular structures. The skin was then prepped in sterile fashion and the target structure was injected under direct visualization using sterile technique as below:  Single injection performed as below: PREP: Alcohol and Ethel Chloride APPROACH:posterior, stopcock technique, 22g 3.5 in. INJECTATE: 5 cc 1% lidocaine, 2 cc 0.5% Marcaine and 2 cc 40mg /mL DepoMedrol ASPIRATE: None DRESSING: Band-Aid  Post procedural instructions including recommending icing and warning signs for infection were reviewed.    This procedure was well tolerated and there were no complications.   IMPRESSION: Succesful Ultrasound Guided: Injection

## 2018-06-18 DIAGNOSIS — L401 Generalized pustular psoriasis: Secondary | ICD-10-CM | POA: Diagnosis not present

## 2018-06-18 DIAGNOSIS — Z6834 Body mass index (BMI) 34.0-34.9, adult: Secondary | ICD-10-CM | POA: Diagnosis not present

## 2018-06-18 DIAGNOSIS — L4050 Arthropathic psoriasis, unspecified: Secondary | ICD-10-CM | POA: Diagnosis not present

## 2018-06-18 DIAGNOSIS — E669 Obesity, unspecified: Secondary | ICD-10-CM | POA: Diagnosis not present

## 2018-06-18 DIAGNOSIS — R945 Abnormal results of liver function studies: Secondary | ICD-10-CM | POA: Diagnosis not present

## 2018-07-07 DIAGNOSIS — L4050 Arthropathic psoriasis, unspecified: Secondary | ICD-10-CM | POA: Diagnosis not present

## 2018-07-07 DIAGNOSIS — Z79899 Other long term (current) drug therapy: Secondary | ICD-10-CM | POA: Diagnosis not present

## 2018-08-17 ENCOUNTER — Encounter: Payer: Self-pay | Admitting: Sports Medicine

## 2018-09-29 DIAGNOSIS — L4050 Arthropathic psoriasis, unspecified: Secondary | ICD-10-CM | POA: Diagnosis not present

## 2018-10-03 DIAGNOSIS — Z23 Encounter for immunization: Secondary | ICD-10-CM | POA: Diagnosis not present

## 2018-12-17 DIAGNOSIS — L4050 Arthropathic psoriasis, unspecified: Secondary | ICD-10-CM | POA: Diagnosis not present

## 2018-12-17 DIAGNOSIS — R945 Abnormal results of liver function studies: Secondary | ICD-10-CM | POA: Diagnosis not present

## 2018-12-17 DIAGNOSIS — L401 Generalized pustular psoriasis: Secondary | ICD-10-CM | POA: Diagnosis not present

## 2018-12-17 DIAGNOSIS — Z6833 Body mass index (BMI) 33.0-33.9, adult: Secondary | ICD-10-CM | POA: Diagnosis not present

## 2018-12-17 DIAGNOSIS — E669 Obesity, unspecified: Secondary | ICD-10-CM | POA: Diagnosis not present

## 2018-12-23 DIAGNOSIS — L4050 Arthropathic psoriasis, unspecified: Secondary | ICD-10-CM | POA: Diagnosis not present

## 2019-03-19 DIAGNOSIS — L4050 Arthropathic psoriasis, unspecified: Secondary | ICD-10-CM | POA: Diagnosis not present

## 2019-06-11 DIAGNOSIS — Z79899 Other long term (current) drug therapy: Secondary | ICD-10-CM | POA: Diagnosis not present

## 2019-06-11 DIAGNOSIS — L4050 Arthropathic psoriasis, unspecified: Secondary | ICD-10-CM | POA: Diagnosis not present

## 2019-06-17 DIAGNOSIS — L401 Generalized pustular psoriasis: Secondary | ICD-10-CM | POA: Diagnosis not present

## 2019-06-17 DIAGNOSIS — L4050 Arthropathic psoriasis, unspecified: Secondary | ICD-10-CM | POA: Diagnosis not present

## 2019-06-17 DIAGNOSIS — R945 Abnormal results of liver function studies: Secondary | ICD-10-CM | POA: Diagnosis not present

## 2019-09-11 DIAGNOSIS — E782 Mixed hyperlipidemia: Secondary | ICD-10-CM | POA: Diagnosis not present

## 2019-09-11 DIAGNOSIS — E1122 Type 2 diabetes mellitus with diabetic chronic kidney disease: Secondary | ICD-10-CM | POA: Diagnosis not present

## 2019-09-11 DIAGNOSIS — I10 Essential (primary) hypertension: Secondary | ICD-10-CM | POA: Diagnosis not present

## 2019-09-15 DIAGNOSIS — E291 Testicular hypofunction: Secondary | ICD-10-CM | POA: Diagnosis not present

## 2019-09-15 DIAGNOSIS — N183 Chronic kidney disease, stage 3 unspecified: Secondary | ICD-10-CM | POA: Diagnosis not present

## 2019-09-15 DIAGNOSIS — I10 Essential (primary) hypertension: Secondary | ICD-10-CM | POA: Diagnosis not present

## 2019-09-15 DIAGNOSIS — E782 Mixed hyperlipidemia: Secondary | ICD-10-CM | POA: Diagnosis not present

## 2019-09-15 DIAGNOSIS — E1122 Type 2 diabetes mellitus with diabetic chronic kidney disease: Secondary | ICD-10-CM | POA: Diagnosis not present

## 2019-09-15 DIAGNOSIS — F322 Major depressive disorder, single episode, severe without psychotic features: Secondary | ICD-10-CM | POA: Diagnosis not present

## 2019-09-15 DIAGNOSIS — R748 Abnormal levels of other serum enzymes: Secondary | ICD-10-CM | POA: Diagnosis not present

## 2019-09-15 DIAGNOSIS — G4733 Obstructive sleep apnea (adult) (pediatric): Secondary | ICD-10-CM | POA: Diagnosis not present

## 2019-09-26 DIAGNOSIS — Z23 Encounter for immunization: Secondary | ICD-10-CM | POA: Diagnosis not present

## 2020-01-06 DIAGNOSIS — G4733 Obstructive sleep apnea (adult) (pediatric): Secondary | ICD-10-CM | POA: Diagnosis not present

## 2020-02-22 DIAGNOSIS — L4050 Arthropathic psoriasis, unspecified: Secondary | ICD-10-CM | POA: Diagnosis not present

## 2020-02-23 DIAGNOSIS — E669 Obesity, unspecified: Secondary | ICD-10-CM | POA: Diagnosis not present

## 2020-02-23 DIAGNOSIS — R945 Abnormal results of liver function studies: Secondary | ICD-10-CM | POA: Diagnosis not present

## 2020-02-23 DIAGNOSIS — L401 Generalized pustular psoriasis: Secondary | ICD-10-CM | POA: Diagnosis not present

## 2020-02-23 DIAGNOSIS — L4050 Arthropathic psoriasis, unspecified: Secondary | ICD-10-CM | POA: Diagnosis not present

## 2020-02-23 DIAGNOSIS — Z6834 Body mass index (BMI) 34.0-34.9, adult: Secondary | ICD-10-CM | POA: Diagnosis not present

## 2020-05-03 DIAGNOSIS — Z961 Presence of intraocular lens: Secondary | ICD-10-CM | POA: Diagnosis not present

## 2020-05-03 DIAGNOSIS — E113293 Type 2 diabetes mellitus with mild nonproliferative diabetic retinopathy without macular edema, bilateral: Secondary | ICD-10-CM | POA: Diagnosis not present

## 2020-05-03 DIAGNOSIS — H52203 Unspecified astigmatism, bilateral: Secondary | ICD-10-CM | POA: Diagnosis not present

## 2020-05-17 DIAGNOSIS — L4050 Arthropathic psoriasis, unspecified: Secondary | ICD-10-CM | POA: Diagnosis not present

## 2020-08-09 DIAGNOSIS — L4050 Arthropathic psoriasis, unspecified: Secondary | ICD-10-CM | POA: Diagnosis not present

## 2020-08-31 DIAGNOSIS — Z23 Encounter for immunization: Secondary | ICD-10-CM | POA: Diagnosis not present

## 2020-10-17 DIAGNOSIS — Z6832 Body mass index (BMI) 32.0-32.9, adult: Secondary | ICD-10-CM | POA: Diagnosis not present

## 2020-10-17 DIAGNOSIS — L4050 Arthropathic psoriasis, unspecified: Secondary | ICD-10-CM | POA: Diagnosis not present

## 2020-10-17 DIAGNOSIS — E669 Obesity, unspecified: Secondary | ICD-10-CM | POA: Diagnosis not present

## 2020-10-17 DIAGNOSIS — L401 Generalized pustular psoriasis: Secondary | ICD-10-CM | POA: Diagnosis not present

## 2020-10-17 DIAGNOSIS — R945 Abnormal results of liver function studies: Secondary | ICD-10-CM | POA: Diagnosis not present

## 2020-11-01 DIAGNOSIS — L4050 Arthropathic psoriasis, unspecified: Secondary | ICD-10-CM | POA: Diagnosis not present

## 2020-11-21 DIAGNOSIS — L4050 Arthropathic psoriasis, unspecified: Secondary | ICD-10-CM | POA: Diagnosis not present

## 2021-01-02 DIAGNOSIS — L4050 Arthropathic psoriasis, unspecified: Secondary | ICD-10-CM | POA: Diagnosis not present

## 2021-01-23 DIAGNOSIS — G4733 Obstructive sleep apnea (adult) (pediatric): Secondary | ICD-10-CM | POA: Diagnosis not present

## 2021-01-30 DIAGNOSIS — L4050 Arthropathic psoriasis, unspecified: Secondary | ICD-10-CM | POA: Diagnosis not present

## 2021-02-07 DIAGNOSIS — E669 Obesity, unspecified: Secondary | ICD-10-CM | POA: Diagnosis not present

## 2021-02-07 DIAGNOSIS — R945 Abnormal results of liver function studies: Secondary | ICD-10-CM | POA: Diagnosis not present

## 2021-02-07 DIAGNOSIS — L401 Generalized pustular psoriasis: Secondary | ICD-10-CM | POA: Diagnosis not present

## 2021-02-07 DIAGNOSIS — L4050 Arthropathic psoriasis, unspecified: Secondary | ICD-10-CM | POA: Diagnosis not present

## 2021-02-07 DIAGNOSIS — Z6832 Body mass index (BMI) 32.0-32.9, adult: Secondary | ICD-10-CM | POA: Diagnosis not present

## 2021-02-22 ENCOUNTER — Other Ambulatory Visit: Payer: Self-pay | Admitting: Family Medicine

## 2021-02-22 DIAGNOSIS — L400 Psoriasis vulgaris: Secondary | ICD-10-CM | POA: Diagnosis not present

## 2021-02-22 DIAGNOSIS — R198 Other specified symptoms and signs involving the digestive system and abdomen: Secondary | ICD-10-CM

## 2021-02-22 DIAGNOSIS — Z79899 Other long term (current) drug therapy: Secondary | ICD-10-CM | POA: Diagnosis not present

## 2021-02-24 DIAGNOSIS — K760 Fatty (change of) liver, not elsewhere classified: Secondary | ICD-10-CM | POA: Diagnosis not present

## 2021-02-24 DIAGNOSIS — R198 Other specified symptoms and signs involving the digestive system and abdomen: Secondary | ICD-10-CM | POA: Diagnosis not present

## 2021-03-01 ENCOUNTER — Other Ambulatory Visit: Payer: Medicare Other

## 2021-03-08 DIAGNOSIS — Z23 Encounter for immunization: Secondary | ICD-10-CM | POA: Diagnosis not present

## 2021-03-24 ENCOUNTER — Ambulatory Visit (INDEPENDENT_AMBULATORY_CARE_PROVIDER_SITE_OTHER): Payer: Medicare Other | Admitting: Gastroenterology

## 2021-03-24 ENCOUNTER — Other Ambulatory Visit: Payer: Self-pay

## 2021-03-24 ENCOUNTER — Other Ambulatory Visit (INDEPENDENT_AMBULATORY_CARE_PROVIDER_SITE_OTHER): Payer: Medicare Other

## 2021-03-24 ENCOUNTER — Encounter: Payer: Self-pay | Admitting: Gastroenterology

## 2021-03-24 VITALS — BP 144/80 | HR 68 | Ht 67.0 in | Wt 212.0 lb

## 2021-03-24 DIAGNOSIS — M129 Arthropathy, unspecified: Secondary | ICD-10-CM

## 2021-03-24 DIAGNOSIS — R945 Abnormal results of liver function studies: Secondary | ICD-10-CM | POA: Diagnosis not present

## 2021-03-24 DIAGNOSIS — R7989 Other specified abnormal findings of blood chemistry: Secondary | ICD-10-CM

## 2021-03-24 DIAGNOSIS — R7401 Elevation of levels of liver transaminase levels: Secondary | ICD-10-CM

## 2021-03-24 DIAGNOSIS — F1021 Alcohol dependence, in remission: Secondary | ICD-10-CM

## 2021-03-24 DIAGNOSIS — K838 Other specified diseases of biliary tract: Secondary | ICD-10-CM | POA: Diagnosis not present

## 2021-03-24 DIAGNOSIS — R932 Abnormal findings on diagnostic imaging of liver and biliary tract: Secondary | ICD-10-CM

## 2021-03-24 DIAGNOSIS — R6889 Other general symptoms and signs: Secondary | ICD-10-CM | POA: Diagnosis not present

## 2021-03-24 LAB — COMPREHENSIVE METABOLIC PANEL
ALT: 60 U/L — ABNORMAL HIGH (ref 0–53)
AST: 38 U/L — ABNORMAL HIGH (ref 0–37)
Albumin: 4 g/dL (ref 3.5–5.2)
Alkaline Phosphatase: 81 U/L (ref 39–117)
BUN: 12 mg/dL (ref 6–23)
CO2: 31 mEq/L (ref 19–32)
Calcium: 9.6 mg/dL (ref 8.4–10.5)
Chloride: 97 mEq/L (ref 96–112)
Creatinine, Ser: 1.02 mg/dL (ref 0.40–1.50)
GFR: 72.03 mL/min (ref >60.00)
Glucose, Bld: 233 mg/dL — ABNORMAL HIGH (ref 70–99)
Potassium: 4.2 mEq/L (ref 3.5–5.1)
Sodium: 136 mEq/L (ref 135–145)
Total Bilirubin: 0.6 mg/dL (ref 0.2–1.2)
Total Protein: 8.3 g/dL (ref 6.0–8.3)

## 2021-03-24 LAB — TSH: TSH: 3.4 u[IU]/mL (ref 0.35–4.50)

## 2021-03-24 LAB — IBC + FERRITIN
Ferritin: 505.1 ng/mL — ABNORMAL HIGH (ref 22.0–322.0)
Iron: 108 ug/dL (ref 42–165)
Saturation Ratios: 34.9 % (ref 20.0–50.0)
Transferrin: 221 mg/dL (ref 212.0–360.0)

## 2021-03-24 LAB — PROTIME-INR
INR: 1.1 ratio — ABNORMAL HIGH (ref 0.8–1.0)
Prothrombin Time: 12 s (ref 9.6–13.1)

## 2021-03-24 LAB — CK: Total CK: 331 U/L — ABNORMAL HIGH (ref 7–232)

## 2021-03-24 LAB — GAMMA GT: GGT: 55 U/L — ABNORMAL HIGH (ref 7–51)

## 2021-03-24 NOTE — Progress Notes (Signed)
2 years of labwork has been received from Dr Jackie Plum office. This has been placed on Dr Donneta Romberg desk for review.

## 2021-03-24 NOTE — Patient Instructions (Addendum)
Your provider has requested that you go to the basement level for lab work before leaving today. Press "B" on the elevator. The lab is located at the first door on the left as you exit the elevator.  You have been scheduled for an MRCP abdomen at [facility] on 04/04/21. Your appointment time is 8:30 am. Please arrive 15 minutes prior to your appointment time for registration purposes. Please make certain not to have anything to eat or drink 4 hours prior to your test. In addition, if you have any metal in your body, have a pacemaker or defibrillator, please be sure to let your ordering physician know. This test typically takes 45 minutes to 1 hour to complete. Should you need to reschedule, please call [phone] to do so.  You will be due for a recall colonoscopy in 09/2021. We will send you a reminder in the mail when it gets closer to that time.  We will request the last 2 years worth of of labs from Dr Mayra Neer.  Congratulations on your alcohol cessation!!! Keep up the good work!  If you are age 41 or older, your body mass index should be between 23-30. Your Body mass index is 33.2 kg/m. If this is out of the aforementioned range listed, please consider follow up with your Primary Care Provider.  Due to recent changes in healthcare laws, you may see the results of your imaging and laboratory studies on MyChart before your provider has had a chance to review them.  We understand that in some cases there may be results that are confusing or concerning to you. Not all laboratory results come back in the same time frame and the provider may be waiting for multiple results in order to interpret others.  Please give [place] 48 hours in order for your provider to thoroughly review all the results before contacting the office for clarification of your results.

## 2021-03-25 LAB — IGA: Immunoglobulin A: 711 mg/dL — ABNORMAL HIGH (ref 70–320)

## 2021-03-26 ENCOUNTER — Encounter: Payer: Self-pay | Admitting: Gastroenterology

## 2021-03-26 DIAGNOSIS — R7989 Other specified abnormal findings of blood chemistry: Secondary | ICD-10-CM | POA: Insufficient documentation

## 2021-03-26 DIAGNOSIS — K838 Other specified diseases of biliary tract: Secondary | ICD-10-CM | POA: Insufficient documentation

## 2021-03-26 DIAGNOSIS — F1021 Alcohol dependence, in remission: Secondary | ICD-10-CM | POA: Insufficient documentation

## 2021-03-26 DIAGNOSIS — R6889 Other general symptoms and signs: Secondary | ICD-10-CM | POA: Insufficient documentation

## 2021-03-26 DIAGNOSIS — R932 Abnormal findings on diagnostic imaging of liver and biliary tract: Secondary | ICD-10-CM | POA: Insufficient documentation

## 2021-03-26 DIAGNOSIS — R7401 Elevation of levels of liver transaminase levels: Secondary | ICD-10-CM | POA: Insufficient documentation

## 2021-03-26 DIAGNOSIS — R945 Abnormal results of liver function studies: Secondary | ICD-10-CM | POA: Insufficient documentation

## 2021-03-26 NOTE — Progress Notes (Signed)
[place] VISIT   Primary Care Provider Mayra Neer, MD [place] [employer] [place] [id number] [phone]  Referring Provider Mayra Neer, MD [place] [employer] [place],  [place] [address] [phone]  Patient Profile: Austin Acosta is a 75 y.o. male with a pmh significant for diabetes (not on medications), obesity, OSA, psoriasis, MDD, hypertension, hyperlipidemia, prior colon polyps (adenomas), abnormal LFTs (per patient years).  The patient presents to the [facility] for an evaluation and management of problem(s) noted below:  Problem List 1. Abnormal LFTs   2. Alcohol use disorder, moderate, in early remission ([place])   3. Dilated bile duct   4. Elevated AST (SGOT)   5. Elevated ALT measurement   6. Abnormal liver ultrasound   7. Other general symptoms and signs      History of Present Illness This is the patient's first visit to the outpatient [place] clinic.  He was previously followed by Dr. Penelope Coop of [facility] gastroenterology.  Patient is referred by his primary care provider due to persistent elevation in liver transferases over the course the last 2 years.  Patient has a significant history of alcohol consumption on a social basis in the years prior to [place].  However during [place] things have become more significant and he has had alcohol consumption of up to 1 bottle of wine per night.  This is a result of him having to be around and interact with his wife whom he describes as a significant alcohol consumer.  After his most recent set of labs were obtained during his physical exam he was also found to have significant progression of his hemoglobin A1c and true diagnosis of diabetes was made.  He is not on medications.  He has stopped all alcohol consumption since that time.  The patient states he has had abnormal liver biochemical testing for years although I only have 2 years worth of labs from his  PCP office.  I have nothing from at the gastroenterology office.  The patient has had progressive weight gain but has had some weight loss since decreasing his alcohol consumption.  Last colonoscopy was done in 2015 by Dr. Penelope Coop with no polyps found but with prior history had recommended a 5-year follow-up for which the patient has not undergone as of yet.  Patient describes no other significant GI symptoms or changes in bowel habits.  He denies any blood in his stools.  He is not interested in colonoscopy right now but wants to optimize his health before we think about further colon cancer screening/colon polyp surveillance.  Patient does not take significant nonsteroidals or BC/Goody powders.  GI Review of Systems Positive as above Negative for dysphagia currently, odynophagia, nausea, vomiting, pain, bloating, change in bowel habits, melena, hematochezia  Review of Systems General: Denies fevers/chills/weight loss unintentionally HEENT: Denies oral lesions/sore throat/headaches/visual changes Cardiovascular: Denies chest pain/palpitations Pulmonary: Denies shortness of breath/cough Gastroenterological: See HPI Genitourinary: Denies darkened urine Hematological: Denies easy bruising/bleeding Endocrine: Denies temperature intolerance Dermatological: Denies jaundice Psychological: Mood is stable   Medications Current Outpatient Medications  Medication Sig Dispense Refill  . aspirin 81 MG tablet Take 81 mg by mouth daily.    . rosuvastatin (CRESTOR) 10 MG tablet Take 10 mg by mouth daily.    Marland Kitchen venlafaxine XR (EFFEXOR-XR) 150 MG 24 hr capsule Take 1 capsule (150 mg total) by mouth daily with breakfast. 30 capsule 2   No current facility-administered medications for this visit.  Allergies Allergies  Allergen Reactions  . Salmon [Fish Allergy]   . Valsartan     Decreased kidney function  . Latex Rash    Histories Past Medical History:  Diagnosis Date  . Arthritis   . CKD  (chronic kidney disease)   . Colon polyps   . Depression   . Diabetes ([place])   . Essential hypertension 06/11/2015  . History of colon polyps   . Hyperlipidemia 06/11/2015  . Obesity (BMI 30.0-34.9) 06/11/2015  . Obstructive sleep apnea on CPAP 06/11/2015  . Psoriatic arthritis mutilans ([place]) 06/11/2015   Past Surgical History:  Procedure Laterality Date  . COLONOSCOPY     Social History   Socioeconomic History  . Marital status: Married    Spouse name: Not on file  . Number of children: Not on file  . Years of education: Not on file  . Highest education level: Not on file  Occupational History  . Occupation: Retired   Tobacco Use  . Smoking status: Former Smoker    Packs/day: 1.50    Years: 20.00    Pack years: 30.00    Types: Cigarettes    Quit date: 01/03/1984    Years since quitting: 37.2  . Smokeless tobacco: Never Used  Vaping Use  . Vaping Use: Not on file  Substance and Sexual Activity  . Alcohol use: Yes    Comment: occ  . Drug use: No  . Sexual activity: Not Currently  Other Topics Concern  . Not on file  Social History Narrative  . Not on file   Social Determinants of Health   Financial Resource Strain: Not on file  Food Insecurity: Not on file  Transportation Needs: Not on file  Physical Activity: Not on file  Stress: Not on file  Social Connections: Not on file  Intimate Partner Violence: Not on file   Family History  Problem Relation Age of Onset  . Coronary artery disease Father   . Heart disease Mother   . Depression Brother   . Schizophrenia Brother   . Paranoid behavior Brother   . ADD / ADHD Neg Hx   . Alcohol abuse Neg Hx   . Anxiety disorder Neg Hx   . Bipolar disorder Neg Hx   . Diabetes Mellitus II Neg Hx   . Colon cancer Neg Hx   . Stomach cancer Neg Hx   . Pancreatic cancer Neg Hx   . Esophageal cancer Neg Hx   . Liver disease Neg Hx   . Inflammatory bowel disease Neg Hx   . Rectal cancer Neg Hx    I have reviewed his medical,  social, and family history in detail and updated the electronic medical record as necessary.    PHYSICAL EXAMINATION  BP (!) 144/80   Pulse 68   Ht _0  (1.702 m)   Wt 212 lb (96.2 kg)   SpO2 96%   BMI 33.20 kg/m  Wt Readings from Last 3 Encounters:  03/24/21 212 lb (96.2 kg)  06/09/18 222 lb 9.6 oz (101 kg)  09/12/16 229 lb 8 oz (104.1 kg)  GEN: NAD, appears stated age, doesn't appear chronically ill PSYCH: Cooperative, without pressured speech EYE: Conjunctivae pink, sclerae anicteric ENT: Masked CV: RR without R/Gs  RESP: No audible wheezing GI: NABS, soft, protuberant abdomen, ventral diastases present, nontender, without rebound or guarding, not able to appreciate hepatosplenomegaly due to body habitus  MSK/EXT: No lower extremity edema SKIN: No jaundice, no spider angiomata NEURO:  Alert &  Oriented x 3, no focal deficits, no evidence of asterixis   REVIEW OF DATA  I reviewed the following data at the time of this encounter:  GI Procedures and Studies  2015 colonoscopy at [facility] The perianal and digital rectal exams were normal. A few small mouth diverticula were found in the sigmoid colon. Internal hemorrhoids were found during retroflexion.  The hemorrhoids were small. The exam was otherwise without abnormality. Repeat colonoscopy in 5 years for surveillance due to personal history of polyps  2010 colonoscopy in [place] A single polyp was found in the ascending colon removed by cold biopsy polypectomy. Medium size sessile polyp was found in the sigmoid colon removed by cold snare polypectomy. 4 polyps were found in the rectum and removed by cold biopsy polypectomy. Schedule repeat colonoscopy in 3 years Pathology consistent with one tubular adenoma and thus reason for 5-year follow-up in 2020  Laboratory Studies  Reviewed those in epic and in outside records  March 2022 labs Total cholesterol 209 Triglycerides 144 LDL 132 Sodium 136 Potassium  4.0 BUN/creatinine 11/0.96 AST/ALT 84/140 Alkaline phosphatase 75 Total bili 0.6 Albumin 4.2 Total protein 7.06 September 2019 labs Sodium 138 Potassium 4.1 BUN/creatinine 14/1.05 AST/ALT 76/121 Alkaline phosphatase 77 Total bili 0.9 Albumin 4.2 Total protein 7.1 Hemoglobin A1c 9.2 Total cholesterol 165 Triglyceride 144 LDL 92  Imaging Studies  February 26, 2021 right upper quadrant abdominal ultrasound The common bile duct measures 7.2 mm.  6 mm consider the upper limits of normal.  Recommend correlation with LFTs.  An MRCP or ERCP could better evaluate for an underlying cause of mild common bile duct dilation. The liver measures up to 18 cm which is prominent.  Normal liver size variable based on sex and body size.  Recommend clinical correlation. Increased echogenicity in the liver is nonspecific but often due to hepatic steatosis.   ASSESSMENT  Austin Acosta is a 75 y.o. male with a pmh significant for diabetes (not on medications), obesity, OSA, psoriasis, MDD, hypertension, hyperlipidemia, prior colon polyps (adenomas), abnormal LFTs (per patient years).  The patient is seen today for evaluation and management of:  1. Abnormal LFTs   2. Alcohol use disorder, moderate, in early remission ([place])   3. Dilated bile duct   4. Elevated AST (SGOT)   5. Elevated ALT measurement   6. Abnormal liver ultrasound   7. Other general symptoms and signs     The patient is hemodynamically stable.  Clinically, he has no significant GI symptoms.  His liver biochemical testing has returned abnormal once again over the course of 2 years worth of labs that I can see he has a persistent elevation of transferases that is greater than 3 times the upper limit of normal.  Normal bilirubin and alkaline phosphatase has been noted.  The pattern is not consistent with solely alcohol consumption but the amount of alcohol that he does consume is significantly concerning for some contribution to the liver  biochemical testing at this time.  He has stopped drinking alcohol so will be interesting to see how his labs look.  In the setting of his liver ultrasound there was some slight dilation of his bile duct.  Some studies suggest that the bile duct can be the size for patient based on the decade of life variance was 7 mm CBD may not be abnormal but will require some additional work-up and evaluation.  Endoscopic ultrasound will likely need to be considered as well as side-viewing endoscopy at some point  but we will begin the work-up with an MRI/MRCP.  Serological neck step work-up will be initiated and we will see how labs look.  Based on the chronicity of symptoms and the patient's untreated diabetes as well as his hyperlipidemia I suspect he has metabolic associated fatty liver disease as the etiology of his symptoms with alcohol playing a significant issue to but we will see how labs look and decide if a liver biopsy may be indicated or further extension of laboratory serological work-up will be required.  Patient is overdue for colon cancer screening/colon polyp surveillance but he is not interested in that at this time but we will consider this at follow-up.  Cologuard testing is not ideal for him since he has a history of adenomatous colon polyps.  All patient questions were answered to the best of my ability, and the patient agrees to the aforementioned plan of action with follow-up as indicated.   PLAN  Laboratories as outlined below MRI/MRCP to be obtained Congratulations on alcohol cessation, continue is important for his health Endoscopic ultrasound with side-viewing endoscopy likely will be recommended at some point to evaluate a dilated bile duct if nothing is found on MRI/MRCP and bile duct dilation persist Colon cancer screening/colon polyp surveillance recommended but deferred by patient for now Recommend diabetes control begin as per PCP recommendations Mediterranean diet discussed Weight  loss important Holding on liver biopsy for now   Orders Placed This Encounter  Procedures  . MR ABDOMEN MRCP W WO CONTAST  . Comp Met (CMET)  . TSH  . IBC + Ferritin  . Tissue transglutaminase, IgA  . INR/PT  . ANA  . Mitochondrial antibodies  . Anti-smooth muscle antibody, IgG  . Hepatitis A antibody, total  . Hepatitis B surface antibody,qualitative  . Hepatitis B surface antigen  . Hepatitis B core antibody, total  . Hepatitis C Antibody  . CK (Creatine Kinase)  . Gamma GT  . IgA    New Prescriptions   No medications on file   Modified Medications   No medications on file    Planned Follow Up No follow-ups on file.   Total Time in Face-to-Face and in Coordination of Care for patient including independent/personal interpretation/review of prior testing, medical history, examination, medication adjustment, communicating results with the patient directly, and documentation with the EHR is 45 minutes.   Justice Britain, MD [facility] Office # [phone]

## 2021-03-27 LAB — TISSUE TRANSGLUTAMINASE, IGA: (tTG) Ab, IgA: <1 U/mL

## 2021-03-28 LAB — HEPATITIS B SURFACE ANTIGEN: Hepatitis B Surface Ag: NONREACTIVE

## 2021-03-28 LAB — HEPATITIS B CORE ANTIBODY, TOTAL: Hep B Core Total Ab: NONREACTIVE

## 2021-03-29 LAB — MITOCHONDRIAL ANTIBODIES: Mitochondrial M2 Ab, IgG: <=20 U

## 2021-03-29 LAB — HEPATITIS A ANTIBODY, TOTAL: Hepatitis A AB,Total: NONREACTIVE

## 2021-03-29 LAB — HEPATITIS C ANTIBODY
Hepatitis C Ab: NONREACTIVE
SIGNAL TO CUT-OFF: 0.01 (ref <1.00)

## 2021-03-29 LAB — ANTI-SMOOTH MUSCLE ANTIBODY, IGG: Actin (Smooth Muscle) Antibody (IGG): <20 U (ref <20)

## 2021-03-29 LAB — ANA: Anti Nuclear Antibody (ANA): NEGATIVE

## 2021-03-29 LAB — HEPATITIS B SURFACE ANTIBODY,QUALITATIVE: Hep B S Ab: NONREACTIVE

## 2021-04-04 ENCOUNTER — Other Ambulatory Visit: Payer: Self-pay

## 2021-04-04 ENCOUNTER — Other Ambulatory Visit: Payer: Self-pay | Admitting: Gastroenterology

## 2021-04-04 ENCOUNTER — Ambulatory Visit (HOSPITAL_COMMUNITY)
Admission: RE | Admit: 2021-04-04 | Discharge: 2021-04-04 | Disposition: A | Discharge status: Home / Self Care | Payer: Medicare Other | Source: Ambulatory Visit | Attending: Gastroenterology | Admitting: Gastroenterology

## 2021-04-04 DIAGNOSIS — F1021 Alcohol dependence, in remission: Secondary | ICD-10-CM | POA: Diagnosis present

## 2021-04-04 DIAGNOSIS — R6889 Other general symptoms and signs: Secondary | ICD-10-CM

## 2021-04-04 DIAGNOSIS — K838 Other specified diseases of biliary tract: Secondary | ICD-10-CM | POA: Diagnosis present

## 2021-04-04 DIAGNOSIS — R7401 Elevation of levels of liver transaminase levels: Secondary | ICD-10-CM | POA: Insufficient documentation

## 2021-04-04 DIAGNOSIS — R7989 Other specified abnormal findings of blood chemistry: Secondary | ICD-10-CM

## 2021-04-04 DIAGNOSIS — K760 Fatty (change of) liver, not elsewhere classified: Secondary | ICD-10-CM | POA: Diagnosis not present

## 2021-04-04 DIAGNOSIS — R945 Abnormal results of liver function studies: Secondary | ICD-10-CM

## 2021-04-04 MED ORDER — GADOBUTROL 1 MMOL/ML IV SOLN
10.0000 mL | Freq: Once | INTRAVENOUS | Status: AC | PRN
  Administered 2021-04-04: 10 mL via INTRAVENOUS

## 2021-05-11 DIAGNOSIS — L401 Generalized pustular psoriasis: Secondary | ICD-10-CM | POA: Diagnosis not present

## 2021-05-11 DIAGNOSIS — Z6832 Body mass index (BMI) 32.0-32.9, adult: Secondary | ICD-10-CM | POA: Diagnosis not present

## 2021-05-11 DIAGNOSIS — L4050 Arthropathic psoriasis, unspecified: Secondary | ICD-10-CM | POA: Diagnosis not present

## 2021-05-11 DIAGNOSIS — E669 Obesity, unspecified: Secondary | ICD-10-CM | POA: Diagnosis not present

## 2021-05-11 DIAGNOSIS — R945 Abnormal results of liver function studies: Secondary | ICD-10-CM | POA: Diagnosis not present

## 2021-06-21 DIAGNOSIS — E1122 Type 2 diabetes mellitus with diabetic chronic kidney disease: Secondary | ICD-10-CM | POA: Diagnosis not present

## 2021-06-21 DIAGNOSIS — E782 Mixed hyperlipidemia: Secondary | ICD-10-CM | POA: Diagnosis not present

## 2021-08-18 ENCOUNTER — Encounter: Payer: Self-pay | Admitting: Gastroenterology

## 2021-08-18 ENCOUNTER — Ambulatory Visit (INDEPENDENT_AMBULATORY_CARE_PROVIDER_SITE_OTHER): Payer: Medicare Other | Admitting: Gastroenterology

## 2021-08-18 ENCOUNTER — Other Ambulatory Visit (INDEPENDENT_AMBULATORY_CARE_PROVIDER_SITE_OTHER): Payer: Medicare Other

## 2021-08-18 ENCOUNTER — Telehealth: Payer: Self-pay

## 2021-08-18 VITALS — BP 148/80 | HR 88 | Ht 66.0 in | Wt 209.5 lb

## 2021-08-18 DIAGNOSIS — R7989 Other specified abnormal findings of blood chemistry: Secondary | ICD-10-CM

## 2021-08-18 DIAGNOSIS — R945 Abnormal results of liver function studies: Secondary | ICD-10-CM

## 2021-08-18 DIAGNOSIS — Z86010 Personal history of colonic polyps: Secondary | ICD-10-CM | POA: Diagnosis not present

## 2021-08-18 DIAGNOSIS — Z1211 Encounter for screening for malignant neoplasm of colon: Secondary | ICD-10-CM

## 2021-08-18 DIAGNOSIS — R932 Abnormal findings on diagnostic imaging of liver and biliary tract: Secondary | ICD-10-CM

## 2021-08-18 DIAGNOSIS — K760 Fatty (change of) liver, not elsewhere classified: Secondary | ICD-10-CM

## 2021-08-18 LAB — IBC + FERRITIN
Ferritin: 725.4 ng/mL — ABNORMAL HIGH (ref 22.0–322.0)
Iron: 125 ug/dL (ref 42–165)
Saturation Ratios: 37.8 % (ref 20.0–50.0)
TIBC: 330.4 ug/dL (ref 250.0–450.0)
Transferrin: 236 mg/dL (ref 212.0–360.0)

## 2021-08-18 LAB — COMPREHENSIVE METABOLIC PANEL
ALT: 101 U/L — ABNORMAL HIGH (ref 0–53)
AST: 63 U/L — ABNORMAL HIGH (ref 0–37)
Albumin: 4.3 g/dL (ref 3.5–5.2)
Alkaline Phosphatase: 68 U/L (ref 39–117)
BUN: 11 mg/dL (ref 6–23)
CO2: 30 mEq/L (ref 19–32)
Calcium: 9.8 mg/dL (ref 8.4–10.5)
Chloride: 98 mEq/L (ref 96–112)
Creatinine, Ser: 1.16 mg/dL (ref 0.40–1.50)
GFR: 61.56 mL/min (ref >60.00)
Glucose, Bld: 261 mg/dL — ABNORMAL HIGH (ref 70–99)
Potassium: 4.4 mEq/L (ref 3.5–5.1)
Sodium: 137 mEq/L (ref 135–145)
Total Bilirubin: 0.6 mg/dL (ref 0.2–1.2)
Total Protein: 8 g/dL (ref 6.0–8.3)

## 2021-08-18 LAB — PROTIME-INR
INR: 1.1 ratio — ABNORMAL HIGH (ref 0.8–1.0)
Prothrombin Time: 12.3 s (ref 9.6–13.1)

## 2021-08-18 MED ORDER — SUPREP BOWEL PREP KIT 17.5-3.13-1.6 GM/177ML PO SOLN: 1.0000 | ORAL | 0 refills | Status: DC

## 2021-08-18 NOTE — Telephone Encounter (Signed)
Lab order entered for Hemochromatosis genetic testing entered for next week. Order for LFT's entered to be drawn in 2-3 months.

## 2021-08-18 NOTE — Patient Instructions (Signed)
We have sent the following medications to your pharmacy for you to pick up at your convenience: [place] provider has requested that you go to the basement level for lab work before leaving today. Press "B" on the elevator. The lab is located at the first door on the left as you exit the elevator.  You have been scheduled for a colonoscopy. Please follow written instructions given to you at your visit today.  Please pick up your prep supplies at the pharmacy within the next 1-3 days. If you use inhalers (even only as needed), please bring them with you on the day of your procedure.  If you are age 75 or older, your body mass index should be between 23-30. Your Body mass index is 33.81 kg/m. If this is out of the aforementioned range listed, please consider follow up with your Primary Care Provider.   __________________________________________________________  The [facility] providers would like to encourage you to use [facility] to communicate with providers for non-urgent requests or questions.  Due to long hold times on the telephone, sending your provider a message by [facility] may be a faster and more efficient way to get a response.  Please allow 48 business hours for a response.  Please remember that this is for non-urgent requests.  Thank you for choosing me and [facility].  Dr. Rush Landmark

## 2021-08-18 NOTE — Progress Notes (Signed)
[place] VISIT   Primary Care Provider Mayra Neer, MD [place] [employer] [place] [address] [phone]   Patient Profile: Austin Acosta is a 75 y.o. male with a pmh significant for diabetes (not on medications), obesity, OSA, psoriasis, MDD, hypertension, hyperlipidemia, prior colon polyps (adenomas), abnormal LFTs (per patient for years), elevated ferritin, fatty liver (per imaging).  The patient presents to the [facility] for an evaluation and management of problem(s) noted below:  Problem List 1. Abnormal LFTs   2. Fatty liver   3. Abnormal MRI, liver   4. Elevated ferritin   5. History of colon polyps   6. Colon cancer screening      History of Present Illness Please see initial consultation note for full details of HPI.  Interval History The patient returns for follow up.  Overall, he has been doing OK.  His wife continues to have issues in regards to her memory and they are planning to be seen at the [facility] next month.  He states his weight is stable to improving.  He has not had complete alcohol cessation as he is drinking some wine sporadically but states it is improved.  He had an elevated ferritin upon his evaluation.  He has no immunity to hepatitis A or hepatitis B.  MRI/MRCP imaging showed no evidence of biliary duct dilation but did show some concern for hepatic steatosis.  Patient is ready and willing to move forward with endoscopic reevaluation from below based on his prior history of polyps and 5-year recall that was due in 2020.  He has having no alteration of his bowel habits.  No abdominal pain.  No blood in his stools.  GI Review of Systems Positive as above Negative for odynophagia, dysphagia, nausea, vomiting, pain, bloating, alteration of bowel habits   Review of Systems General: Denies fevers/chills/weight loss unintentionally Cardiovascular: Denies chest  pain Pulmonary: Denies shortness of breath Gastroenterological: See HPI Genitourinary: Denies darkened urine Hematological: Denies easy bruising/bleeding Dermatological: Denies jaundice Psychological: Mood is stable   Medications Current Outpatient Medications  Medication Sig Dispense Refill   aspirin 81 MG tablet Take 81 mg by mouth daily.     augmented betamethasone dipropionate (DIPROLENE-AF) 0.05 % cream Apply 1 application topically 2 (two) times daily.     rosuvastatin (CRESTOR) 10 MG tablet Take 10 mg by mouth daily.     Secukinumab (COSENTYX [place]) Inject 1 Dose into the skin every 30 (thirty) days.     SUPREP BOWEL PREP KIT 17.5-3.13-1.6 GM/177ML SOLN Take 1 kit by mouth as directed. For colonoscopy prep 354 mL 0   venlafaxine XR (EFFEXOR-XR) 150 MG 24 hr capsule Take 1 capsule (150 mg total) by mouth daily with breakfast. 30 capsule 2   No current facility-administered medications for this visit.    Allergies Allergies  Allergen Reactions   Salmon [Fish Allergy]    Valsartan     Decreased kidney function   Latex Rash    Histories Past Medical History:  Diagnosis Date   Arthritis    CKD (chronic kidney disease)    Colon polyps    Depression    Diabetes ([place])    Essential hypertension 06/11/2015   History of colon polyps    Hyperlipidemia 06/11/2015   Obesity (BMI 30.0-34.9) 06/11/2015   Obstructive sleep apnea on CPAP 06/11/2015   Psoriatic arthritis mutilans ([place]) 06/11/2015   Past Surgical History:  Procedure Laterality Date   COLONOSCOPY  Social History   Socioeconomic History   Marital status: Married    Spouse name: Not on file   Number of children: Not on file   Years of education: Not on file   Highest education level: Not on file  Occupational History   Occupation: Retired   Tobacco Use   Smoking status: Former    Packs/day: 1.50    Years: 20.00    Pack years: 30.00    Types: Cigarettes    Quit date: 01/03/1984    Years since quitting: 37.6    Smokeless tobacco: Never  Vaping Use   Vaping Use: Not on file  Substance and Sexual Activity   Alcohol use: Yes    Comment: occ   Drug use: No   Sexual activity: Not Currently  Other Topics Concern   Not on file  Social History Narrative   Not on file   Social Determinants of Health   Financial Resource Strain: Not on file  Food Insecurity: Not on file  Transportation Needs: Not on file  Physical Activity: Not on file  Stress: Not on file  Social Connections: Not on file  Intimate Partner Violence: Not on file   Family History  Problem Relation Age of Onset   Coronary artery disease Father    Heart disease Mother    Depression Brother    Schizophrenia Brother    Paranoid behavior Brother    ADD / ADHD Neg Hx    Alcohol abuse Neg Hx    Anxiety disorder Neg Hx    Bipolar disorder Neg Hx    Diabetes Mellitus II Neg Hx    Colon cancer Neg Hx    Stomach cancer Neg Hx    Pancreatic cancer Neg Hx    Esophageal cancer Neg Hx    Liver disease Neg Hx    Inflammatory bowel disease Neg Hx    Rectal cancer Neg Hx    I have reviewed his medical, social, and family history in detail and updated the electronic medical record as necessary.    PHYSICAL EXAMINATION  BP (!) 148/80 (BP Location: Left Arm, Patient Position: Sitting, Cuff Size: Normal)   Pulse 88   Ht $R'5\' 6"'Cu$  (1.676 m) Comment: height measured without shoes  Wt 209 lb 8 oz (95 kg)   BMI 33.81 kg/m  Wt Readings from Last 3 Encounters:  08/18/21 209 lb 8 oz (95 kg)  03/24/21 212 lb (96.2 kg)  06/09/18 222 lb 9.6 oz (101 kg)  GEN: NAD, appears stated age, doesn't appear chronically ill PSYCH: Cooperative, without pressured speech EYE: Conjunctivae pink, sclerae anicteric ENT: MMM CV: Nontachycardic RESP: No audible wheezing GI: NABS, soft, protuberant abdomen, ventral diastases present, nontender, without rebound or guarding MSK/EXT: No lower extremity edema SKIN: No jaundice, no spider angiomata NEURO:  Alert  & Oriented x 3, no focal deficits, no evidence of asterixis   REVIEW OF DATA  I reviewed the following data at the time of this encounter:  GI Procedures and Studies  Previously reviewed  Laboratory Studies  Reviewed those in epic and care everywhere  Imaging Studies  May 2022 [facility]P IMPRESSION: Mild diffuse hepatic steatosis. No evidence of biliary ductal dilatation, choledocholithiasis, or other acute findings.    ASSESSMENT  Austin Acosta is a 75 y.o. male with a pmh significant for diabetes (not on medications), obesity, OSA, psoriasis, MDD, hypertension, hyperlipidemia, prior colon polyps (adenomas), abnormal LFTs (per patient for years), elevated ferritin, fatty liver (per imaging).  The patient is  seen today for evaluation and management of:  1. Abnormal LFTs   2. Fatty liver   3. Abnormal MRI, liver   4. Elevated ferritin   5. History of colon polyps   6. Colon cancer screening    The patient is clinically and hemodynamically stable.  We will see how his liver tests look these months out after continued alcohol use decrease.  He is still drinking somewhat.  Imaging has suggested hepatic steatosis/fatty liver which could be a result of his metabolic associated syndromes plus his prior history of alcohol consumption.  Time will tell.  He had an elevated ferritin so we will repeat that if it remains elevated he may need hemochromatosis evaluation.  He has a history of prior colon polyps and was due for follow-up in 2020 but deferred on that previously but he is ready to move forward with that at this time.  The risks and benefits of endoscopic evaluation were discussed with the patient; these include but are not limited to the risk of perforation, infection, bleeding, missed lesions, lack of diagnosis, severe illness requiring hospitalization, as well as anesthesia and sedation related illnesses.  The patient and/or family is agreeable to proceed.  All patient questions were answered  to the best of my ability, and the patient agrees to the aforementioned plan of action with follow-up as indicated.   PLAN  Laboratories as outlined below Weight loss important Holding on liver biopsy for now Continue to work on alcohol cessation/minimization colon polyp surveillance/colon cancer screening to be scheduled for   Orders Placed This Encounter  Procedures   Comp Met (CMET)   IBC + Ferritin   INR/PT   Ceruloplasmin   Ambulatory referral to Gastroenterology     New Prescriptions   SUPREP BOWEL PREP KIT 17.5-3.13-1.6 GM/177ML SOLN    Take 1 kit by mouth as directed. For colonoscopy prep   Modified Medications   No medications on file    Planned Follow Up No follow-ups on file.   Total Time in Face-to-Face and in Coordination of Care for patient including independent/personal interpretation/review of prior testing, medical history, examination, medication adjustment, communicating results with the patient directly, and documentation with the EHR is 25 minutes.   Justice Britain, MD [facility]oscopy Office # [phone]

## 2021-08-18 NOTE — Telephone Encounter (Signed)
-----   Message from Irving Copas., MD sent at 08/18/2021  3:03 PM EDT ----- Austin Acosta, Patient's ferritin remains elevated as well as his LFTs.  I have already released the results on MyChart to him. Please place a laboratory for hemochromatosis genetic testing to be performed to further evaluate his abnormal LFTs and elevated ferritin. He will come in at some point in the course of the next few weeks. LFTs to be rechecked in 2 to 3 months. Thanks. GM

## 2021-08-19 DIAGNOSIS — Z1211 Encounter for screening for malignant neoplasm of colon: Secondary | ICD-10-CM | POA: Insufficient documentation

## 2021-08-19 DIAGNOSIS — Z860100 Personal history of colon polyps, unspecified: Secondary | ICD-10-CM | POA: Insufficient documentation

## 2021-08-19 DIAGNOSIS — R932 Abnormal findings on diagnostic imaging of liver and biliary tract: Secondary | ICD-10-CM | POA: Insufficient documentation

## 2021-08-19 DIAGNOSIS — R7989 Other specified abnormal findings of blood chemistry: Secondary | ICD-10-CM | POA: Insufficient documentation

## 2021-08-19 DIAGNOSIS — K760 Fatty (change of) liver, not elsewhere classified: Secondary | ICD-10-CM | POA: Insufficient documentation

## 2021-08-21 LAB — CERULOPLASMIN: Ceruloplasmin: 24 mg/dL (ref 18–36)

## 2021-08-24 ENCOUNTER — Ambulatory Visit (AMBULATORY_SURGERY_CENTER): Payer: Medicare Other | Admitting: Gastroenterology

## 2021-08-24 ENCOUNTER — Encounter: Payer: Self-pay | Admitting: Gastroenterology

## 2021-08-24 ENCOUNTER — Telehealth: Payer: Self-pay

## 2021-08-24 ENCOUNTER — Other Ambulatory Visit: Payer: Self-pay

## 2021-08-24 VITALS — BP 144/78 | HR 67 | Temp 98.4°F | Resp 19 | Ht 66.0 in | Wt 209.0 lb

## 2021-08-24 DIAGNOSIS — D120 Benign neoplasm of cecum: Secondary | ICD-10-CM | POA: Diagnosis not present

## 2021-08-24 DIAGNOSIS — D123 Benign neoplasm of transverse colon: Secondary | ICD-10-CM

## 2021-08-24 DIAGNOSIS — Z86010 Personal history of colonic polyps: Secondary | ICD-10-CM | POA: Diagnosis not present

## 2021-08-24 DIAGNOSIS — E669 Obesity, unspecified: Secondary | ICD-10-CM | POA: Diagnosis not present

## 2021-08-24 DIAGNOSIS — D124 Benign neoplasm of descending colon: Secondary | ICD-10-CM | POA: Diagnosis not present

## 2021-08-24 DIAGNOSIS — E119 Type 2 diabetes mellitus without complications: Secondary | ICD-10-CM | POA: Diagnosis not present

## 2021-08-24 DIAGNOSIS — I10 Essential (primary) hypertension: Secondary | ICD-10-CM | POA: Diagnosis not present

## 2021-08-24 DIAGNOSIS — R945 Abnormal results of liver function studies: Secondary | ICD-10-CM | POA: Diagnosis not present

## 2021-08-24 DIAGNOSIS — N189 Chronic kidney disease, unspecified: Secondary | ICD-10-CM | POA: Diagnosis not present

## 2021-08-24 DIAGNOSIS — F329 Major depressive disorder, single episode, unspecified: Secondary | ICD-10-CM | POA: Diagnosis not present

## 2021-08-24 DIAGNOSIS — G4733 Obstructive sleep apnea (adult) (pediatric): Secondary | ICD-10-CM | POA: Diagnosis not present

## 2021-08-24 DIAGNOSIS — D122 Benign neoplasm of ascending colon: Secondary | ICD-10-CM | POA: Diagnosis not present

## 2021-08-24 DIAGNOSIS — R7989 Other specified abnormal findings of blood chemistry: Secondary | ICD-10-CM

## 2021-08-24 MED ORDER — SODIUM CHLORIDE 0.9 % IV SOLN: 500.0000 mL | Freq: Once | INTRAVENOUS | Status: DC

## 2021-08-24 NOTE — Patient Instructions (Signed)
High fiber diet. Use Fibercon 1-2 tablets by mouth daily. Continue present medications. Awaiting pathology results. Repeat Colonoscopy in 3 years for surveillance.  YOU HAD AN ENDOSCOPIC PROCEDURE TODAY AT [facility]:   Refer to the procedure report that was given to you for any specific questions about what was found during the examination.  If the procedure report does not answer your questions, please call your gastroenterologist to clarify.  If you requested that your care partner not be given the details of your procedure findings, then the procedure report has been included in a sealed envelope for you to review at your convenience later.  YOU SHOULD EXPECT: Some feelings of bloating in the abdomen. Passage of more gas than usual.  Walking can help get rid of the air that was put into your GI tract during the procedure and reduce the bloating. If you had a lower endoscopy (such as a colonoscopy or flexible sigmoidoscopy) you may notice spotting of blood in your stool or on the toilet paper. If you underwent a bowel prep for your procedure, you may not have a normal bowel movement for a few days.  Please Note:  You might notice some irritation and congestion in your nose or some drainage.  This is from the oxygen used during your procedure.  There is no need for concern and it should clear up in a day or so.  SYMPTOMS TO REPORT IMMEDIATELY:  Following lower endoscopy (colonoscopy or flexible sigmoidoscopy):  Excessive amounts of blood in the stool  Significant tenderness or worsening of abdominal pains  Swelling of the abdomen that is new, acute  Fever of 100F or higher  For urgent or emergent issues, a gastroenterologist can be reached at any hour by calling [phone]. Do not use MyChart messaging for urgent concerns.    DIET:  We do recommend a small meal at first, but then you may proceed to your regular diet.  Drink plenty of fluids but you should avoid alcoholic  beverages for 24 hours.  ACTIVITY:  You should plan to take it easy for the rest of today and you should NOT DRIVE or use heavy machinery until tomorrow (because of the sedation medicines used during the test).    FOLLOW UP: Our staff will call the number listed on your records 48-72 hours following your procedure to check on you and address any questions or concerns that you may have regarding the information given to you following your procedure. If we do not reach you, we will leave a message.  We will attempt to reach you two times.  During this call, we will ask if you have developed any symptoms of COVID 19. If you develop any symptoms (ie: fever, flu-like symptoms, shortness of breath, cough etc.) before then, please call [phone].  If you test positive for Covid 19 in the 2 weeks post procedure, please call and report this information to [place].    If any biopsies were taken you will be contacted by phone or by letter within the next 1-3 weeks.  Please call us at [phone] if you have not heard about the biopsies in 3 weeks.    SIGNATURES/CONFIDENTIALITY: You and/or your care partner have signed paperwork which will be entered into your electronic medical record.  These signatures attest to the fact that that the information above on your After Visit Summary has been reviewed and is understood.  Full responsibility of the confidentiality of this discharge information lies with  you and/or your care-partner.

## 2021-08-24 NOTE — Op Note (Signed)
[facility] Patient Name: Austin Acosta Procedure Date: 08/24/2021 2:50 PM MRN: [id number] Endoscopist: Justice Britain , MD Age: 75 Referring MD:  Date of Birth: 25-Nov-1946 Gender: Male Account #: [id number] Procedure:                Colonoscopy Indications:              Surveillance: Personal history of adenomatous                            polyps on last colonoscopy > 5 years ago Medicines:                Monitored Anesthesia Care Procedure:                Pre-Anesthesia Assessment:                           - Prior to the procedure, a History and Physical                            was performed, and patient medications and                            allergies were reviewed. The patient's tolerance of                            previous anesthesia was also reviewed. The risks                            and benefits of the procedure and the sedation                            options and risks were discussed with the patient.                            All questions were answered, and informed consent                            was obtained. Prior Anticoagulants: The patient has                            taken no previous anticoagulant or antiplatelet                            agents except for aspirin. ASA Grade Assessment: II                            - A patient with mild systemic disease. After                            reviewing the risks and benefits, the patient was                            deemed in satisfactory condition to undergo the  procedure.                           After obtaining informed consent, the colonoscope                            was passed under direct vision. Throughout the                            procedure, the patient's blood pressure, pulse, and                            oxygen saturations were monitored continuously. The                            CF [id number] #[id number] was introduced through the anus                             and advanced to the 5 cm into the ileum. The                            Olympus CF-[id number]) Colonoscope was                            introduced through the anus and advanced to the 5                            cm into the ileum. The colonoscopy was performed                            without difficulty. The patient tolerated the                            procedure. The quality of the bowel preparation was                            adequate. The terminal ileum, ileocecal valve,                            appendiceal orifice, and rectum were photographed. Scope In: 2:56:03 PM Scope Out: 3:26:16 PM Scope Withdrawal Time: 0 hours 27 minutes 51 seconds  Total Procedure Duration: 0 hours 30 minutes 13 seconds  Findings:                 The digital rectal exam findings include                            hemorrhoids. Pertinent negatives include no                            palpable rectal lesions.                           The terminal ileum and ileocecal valve appeared  normal.                           Eight sessile polyps were found in the descending                            colon (1), hepatic flexure (1), ascending colon (4)                            and cecum (2). The polyps were 2 to 10 mm in size.                           Normal mucosa was found in the entire colon                            otherwise.                           Non-bleeding non-thrombosed external and internal                            hemorrhoids were found during retroflexion, during                            perianal exam and during digital exam. The                            hemorrhoids were Grade II (internal hemorrhoids                            that prolapse but reduce spontaneously). Complications:            No immediate complications. Estimated Blood Loss:     Estimated blood loss was minimal. Impression:               - Hemorrhoids  found on digital rectal exam.                           - The examined portion of the ileum was normal.                           - Eight 2 to 10 mm polyps in the descending colon,                            at the hepatic flexure, in the ascending colon and                            in the cecum.                           - Normal mucosa in the entire examined colon                            otherwise.                           -  Non-bleeding non-thrombosed external and internal                            hemorrhoids. Recommendation:           - The patient will be observed post-procedure,                            until all discharge criteria are met.                           - Discharge patient to home.                           - Patient has a contact number available for                            emergencies. The signs and symptoms of potential                            delayed complications were discussed with the                            patient. Return to normal activities tomorrow.                            Written discharge instructions were provided to the                            patient.                           - High fiber diet.                           - Use FiberCon 1-2 tablets PO daily.                           - Continue present medications.                           - Await pathology results.                           - Repeat colonoscopy in 3 years for surveillance.                           - The findings and recommendations were discussed                            with the patient.                           - The findings and recommendations were discussed                            with the patient's family. Justice Britain, MD 08/24/2021 3:37:19  PM

## 2021-08-24 NOTE — Progress Notes (Signed)
GASTROENTEROLOGY PROCEDURE H&P NOTE   Primary Care Physician: Mayra Neer, MD  HPI: Austin Acosta is a 75 y.o. male who presents for Colonoscopy for history of Colon Polyps last in 2015.  Past Medical History:  Diagnosis Date   Arthritis    CKD (chronic kidney disease)    Colon polyps    Depression    Diabetes ([place])    Essential hypertension 06/11/2015   History of colon polyps    Hyperlipidemia 06/11/2015   Obesity (BMI 30.0-34.9) 06/11/2015   Obstructive sleep apnea on CPAP 06/11/2015   Psoriatic arthritis mutilans ([place]) 06/11/2015   Past Surgical History:  Procedure Laterality Date   COLONOSCOPY     Current Outpatient Medications  Medication Sig Dispense Refill   aspirin 81 MG tablet Take 81 mg by mouth daily.     augmented betamethasone dipropionate (DIPROLENE-AF) 0.05 % cream Apply 1 application topically 2 (two) times daily.     rosuvastatin (CRESTOR) 10 MG tablet Take 10 mg by mouth daily.     venlafaxine XR (EFFEXOR-XR) 150 MG 24 hr capsule Take 1 capsule (150 mg total) by mouth daily with breakfast. 30 capsule 2   Secukinumab (COSENTYX [place]) Inject 1 Dose into the skin every 30 (thirty) days.     Current Facility-Administered Medications  Medication Dose Route Frequency Provider Last Rate Last Admin   0.9 %  sodium chloride infusion  500 mL Intravenous Once Mansouraty, Telford Nab., MD        Current Outpatient Medications:    aspirin 81 MG tablet, Take 81 mg by mouth daily., Disp: , Rfl:    augmented betamethasone dipropionate (DIPROLENE-AF) 0.05 % cream, Apply 1 application topically 2 (two) times daily., Disp: , Rfl:    rosuvastatin (CRESTOR) 10 MG tablet, Take 10 mg by mouth daily., Disp: , Rfl:    venlafaxine XR (EFFEXOR-XR) 150 MG 24 hr capsule, Take 1 capsule (150 mg total) by mouth daily with breakfast., Disp: 30 capsule, Rfl: 2   Secukinumab (COSENTYX [place]), Inject 1 Dose into the skin every 30 (thirty) days., Disp: , Rfl:   Current Facility-Administered  Medications:    0.9 %  sodium chloride infusion, 500 mL, Intravenous, Once, Mansouraty, Telford Nab., MD Allergies  Allergen Reactions   Hyman Hopes Allergy]    Valsartan     Decreased kidney function   Latex Rash   Family History  Problem Relation Age of Onset   Coronary artery disease Father    Heart disease Mother    Depression Brother    Schizophrenia Brother    Paranoid behavior Brother    ADD / ADHD Neg Hx    Alcohol abuse Neg Hx    Anxiety disorder Neg Hx    Bipolar disorder Neg Hx    Diabetes Mellitus II Neg Hx    Colon cancer Neg Hx    Stomach cancer Neg Hx    Pancreatic cancer Neg Hx    Esophageal cancer Neg Hx    Liver disease Neg Hx    Inflammatory bowel disease Neg Hx    Rectal cancer Neg Hx    Social History   Socioeconomic History   Marital status: Married    Spouse name: Not on file   Number of children: Not on file   Years of education: Not on file   Highest education level: Not on file  Occupational History   Occupation: Retired   Tobacco Use   Smoking status: Former    Packs/day: 1.50  Years: 20.00    Pack years: 30.00    Types: Cigarettes    Quit date: 01/03/1984    Years since quitting: 37.6   Smokeless tobacco: Never  Vaping Use   Vaping Use: Not on file  Substance and Sexual Activity   Alcohol use: Yes    Comment: occ   Drug use: No   Sexual activity: Not Currently  Other Topics Concern   Not on file  Social History Narrative   Not on file   Social Determinants of Health   Financial Resource Strain: Not on file  Food Insecurity: Not on file  Transportation Needs: Not on file  Physical Activity: Not on file  Stress: Not on file  Social Connections: Not on file  Intimate Partner Violence: Not on file    Physical Exam: Today's Vitals   08/24/21 1407 08/24/21 1414  BP: 140/79   Pulse: 75   Temp: 98.4 F (36.9 C) 98.4 F (36.9 C)  TempSrc: Other (Comment)   SpO2: 96%   Weight: 209 lb (94.8 kg)   Height: 5\' 6"  (1.676  m)    Body mass index is 33.73 kg/m. GEN: NAD EYE: Sclerae anicteric ENT: MMM CV: Non-tachycardic GI: Soft, protuberant abdomen NEURO:  Alert & Oriented x 3  Lab Results: No results for input(s): WBC, HGB, HCT, PLT in the last 72 hours. BMET No results for input(s): NA, K, CL, CO2, GLUCOSE, BUN, CREATININE, CALCIUM in the last 72 hours. LFT No results for input(s): PROT, ALBUMIN, AST, ALT, ALKPHOS, BILITOT, BILIDIR, IBILI in the last 72 hours. PT/INR No results for input(s): LABPROT, INR in the last 72 hours.   Impression / Plan: This is a 75 y.o.male who presents for Colonoscopy for history of Colon Polyps last in 2015.  The risks and benefits of endoscopic evaluation/treatment were discussed with the patient and/or family; these include but are not limited to the risk of perforation, infection, bleeding, missed lesions, lack of diagnosis, severe illness requiring hospitalization, as well as anesthesia and sedation related illnesses.  The patient's history has been reviewed, patient examined, no change in status, and deemed stable for procedure.  The patient and/or family is agreeable to proceed.    Justice Britain, MD [facility] Advanced [facility]oscopy Office # [phone]

## 2021-08-24 NOTE — Progress Notes (Signed)
Called to room to assist during endoscopic procedure.  Patient ID and intended procedure confirmed with present staff. Received instructions for my participation in the procedure from the performing physician.  

## 2021-08-24 NOTE — Progress Notes (Signed)
A/ox3, pleased with MAC, report to RN 

## 2021-08-24 NOTE — Progress Notes (Signed)
VS taken by DT 

## 2021-08-28 ENCOUNTER — Telehealth: Payer: Self-pay

## 2021-08-28 NOTE — Telephone Encounter (Signed)
Attempted to reach patient for post-procedure f/u call. No answer. Left message that staff will make another attempt to reach him later today and for him to please not hesitate to call us if he has any questions/concerns regarding his care. 

## 2021-08-28 NOTE — Telephone Encounter (Signed)
  Follow up Call-  Call back number 08/24/2021  Post procedure Call Back phone  # [phone]  Permission to leave phone message Yes  Some recent data might be hidden     Patient questions:  Do you have a fever, pain , or abdominal swelling? No. Pain Score  0 *  Have you tolerated food without any problems? Yes.    Have you been able to return to your normal activities? Yes.    Do you have any questions about your discharge instructions: Diet   No. Medications  No. Follow up visit  No.  Do you have questions or concerns about your Care? No.  Actions: * If pain score is 4 or above: No action needed, pain <4.

## 2021-08-29 ENCOUNTER — Encounter: Payer: Self-pay | Admitting: Gastroenterology

## 2021-09-13 NOTE — Telephone Encounter (Signed)
  Follow up Call-  Call back number 08/24/2021  Post procedure Call Back phone  # [phone]  Permission to leave phone message Yes  Some recent data might be hidden     Patient questions:  Do you have a fever, pain , or abdominal swelling? No. Pain Score  0 *  Have you tolerated food without any problems? Yes.    Have you been able to return to your normal activities? Yes.    Do you have any questions about your discharge instructions: Diet   No. Medications  No. Follow up visit  No.  Do you have questions or concerns about your Care? No.  Actions: * If pain score is 4 or above: No action needed, pain <4.

## 2021-10-31 DIAGNOSIS — E1122 Type 2 diabetes mellitus with diabetic chronic kidney disease: Secondary | ICD-10-CM | POA: Diagnosis not present

## 2021-10-31 DIAGNOSIS — E782 Mixed hyperlipidemia: Secondary | ICD-10-CM | POA: Diagnosis not present

## 2021-11-03 ENCOUNTER — Encounter: Payer: Self-pay | Admitting: Gastroenterology

## 2021-11-06 DIAGNOSIS — Z23 Encounter for immunization: Secondary | ICD-10-CM | POA: Diagnosis not present

## 2021-11-08 ENCOUNTER — Encounter: Payer: Self-pay | Admitting: Gastroenterology

## 2021-11-08 NOTE — Progress Notes (Signed)
October 31, 2021 labs from outside facility to be scanned into chart Hemoglobin A1c 8.7 Sodium 139 Potassium 4.2 BUN/creatinine 10/1.03 AST/ALT 56/81 Alk phos 74 T bili 0.8 Albumin 4.4 Total protein 7.9 Calcium 9.5 Total cholesterol 179 HDL 47 LDL 102

## 2021-11-14 DIAGNOSIS — R945 Abnormal results of liver function studies: Secondary | ICD-10-CM | POA: Diagnosis not present

## 2021-11-14 DIAGNOSIS — L4050 Arthropathic psoriasis, unspecified: Secondary | ICD-10-CM | POA: Diagnosis not present

## 2021-11-14 DIAGNOSIS — Z6832 Body mass index (BMI) 32.0-32.9, adult: Secondary | ICD-10-CM | POA: Diagnosis not present

## 2021-11-14 DIAGNOSIS — E669 Obesity, unspecified: Secondary | ICD-10-CM | POA: Diagnosis not present

## 2021-11-14 DIAGNOSIS — L401 Generalized pustular psoriasis: Secondary | ICD-10-CM | POA: Diagnosis not present

## 2021-11-14 DIAGNOSIS — Z111 Encounter for screening for respiratory tuberculosis: Secondary | ICD-10-CM | POA: Diagnosis not present

## 2021-11-14 DIAGNOSIS — R5383 Other fatigue: Secondary | ICD-10-CM | POA: Diagnosis not present

## 2021-11-15 ENCOUNTER — Ambulatory Visit: Payer: Medicare Other | Admitting: Physician Assistant

## 2021-12-22 ENCOUNTER — Ambulatory Visit (INDEPENDENT_AMBULATORY_CARE_PROVIDER_SITE_OTHER): Payer: Medicare Other | Admitting: Gastroenterology

## 2021-12-22 ENCOUNTER — Other Ambulatory Visit: Payer: Medicare Other

## 2021-12-22 ENCOUNTER — Encounter: Payer: Self-pay | Admitting: Gastroenterology

## 2021-12-22 VITALS — BP 160/80 | HR 82 | Ht 66.5 in | Wt 205.0 lb

## 2021-12-22 DIAGNOSIS — K580 Irritable bowel syndrome with diarrhea: Secondary | ICD-10-CM

## 2021-12-22 DIAGNOSIS — A480 Gas gangrene: Secondary | ICD-10-CM

## 2021-12-22 DIAGNOSIS — Z86010 Personal history of colonic polyps: Secondary | ICD-10-CM | POA: Diagnosis not present

## 2021-12-22 DIAGNOSIS — R194 Change in bowel habit: Secondary | ICD-10-CM

## 2021-12-22 DIAGNOSIS — K760 Fatty (change of) liver, not elsewhere classified: Secondary | ICD-10-CM

## 2021-12-22 DIAGNOSIS — R141 Gas pain: Secondary | ICD-10-CM | POA: Diagnosis not present

## 2021-12-22 DIAGNOSIS — R7989 Other specified abnormal findings of blood chemistry: Secondary | ICD-10-CM

## 2021-12-22 DIAGNOSIS — R140 Abdominal distension (gaseous): Secondary | ICD-10-CM

## 2021-12-22 NOTE — Patient Instructions (Signed)
Your provider has requested that you go to the basement level for lab work before leaving today. Press "B" on the elevator. The lab is located at the first door on the left as you exit the elevator.  If all stool studies come back normal, then consider possible SIBO Breath Test.   Prescription to discuss with PCP for wife is called Namenda.   If you are age 76 or older, your body mass index should be between 23-30. Your Body mass index is 32.59 kg/m. If this is out of the aforementioned range listed, please consider follow up with your Primary Care Provider.    ________________________________________________________  The [facility] providers would like to encourage you to use [facility] to communicate with providers for non-urgent requests or questions.  Due to long hold times on the telephone, sending your provider a message by [facility] may be a faster and more efficient way to get a response.  Please allow 48 business hours for a response.  Please remember that this is for non-urgent requests.  _______________________________________________________  Due to recent changes in healthcare laws, you may see the results of your imaging and laboratory studies on MyChart before your provider has had a chance to review them.  We understand that in some cases there may be results that are confusing or concerning to you. Not all laboratory results come back in the same time frame and the provider may be waiting for multiple results in order to interpret others.  Please give [place] 48 hours in order for your provider to thoroughly review all the results before contacting the office for clarification of your results.   Thank you for choosing me and [facility].  Dr. Rush Landmark

## 2021-12-23 ENCOUNTER — Encounter: Payer: Self-pay | Admitting: Gastroenterology

## 2021-12-23 DIAGNOSIS — R141 Gas pain: Secondary | ICD-10-CM | POA: Insufficient documentation

## 2021-12-23 DIAGNOSIS — R194 Change in bowel habit: Secondary | ICD-10-CM | POA: Insufficient documentation

## 2021-12-23 DIAGNOSIS — Z86010 Personal history of colonic polyps: Secondary | ICD-10-CM | POA: Insufficient documentation

## 2021-12-23 DIAGNOSIS — K580 Irritable bowel syndrome with diarrhea: Secondary | ICD-10-CM | POA: Insufficient documentation

## 2021-12-23 DIAGNOSIS — R140 Abdominal distension (gaseous): Secondary | ICD-10-CM | POA: Insufficient documentation

## 2021-12-23 NOTE — Progress Notes (Signed)
[facility] VISIT   Primary Care Provider Mayra Neer, MD [place] [employer] [place] [address] [phone]   Patient Profile: Austin Acosta is a 76 y.o. male with a pmh significant for diabetes (not on medications), obesity, OSA, psoriasis, MDD, hypertension, hyperlipidemia, prior colon polyps (TAs & SSPs), abnormal LFTs (per patient for years), elevated ferritin, fatty liver (per imaging).  The patient presents to the [facility] for an evaluation and management of problem(s) noted below:  Problem List 1. Change in bowel habits   2. Bloating   3. Gas pain   4. Irritable bowel syndrome with diarrhea   5. Hx of adenomatous colonic polyps     History of Present Illness Please see prior notes for full details of HPI.  Interval History The patient returns for follow-up.  He states that since his surveillance colonoscopy in September 2022, he has had an alteration of his bowel habits.  At one point he was experiencing more unformed bowel movements/diarrhea.  This mostly occurs after he eats meals and as such he has began to adjust his diet in an effort of trying to minimize these alteration of bowel habits.  I had seen his wife for another procedure in the end of last year and when he told me about some of his symptoms, I told him to try and adjust his diet somewhat.  He is experiencing abdominal gas and bloating and cramping.  His bowel movements now occur on a daily to every other day basis.  However they remain abnormal in the sense that they are small and unformed.  Do not float.  When he told his rheumatologist about these particular symptoms, and as he had initiated a new medication called Cosentyx in the months preceding, his rheumatologist told him that it was possible this was a medication side effect so he stopped Cosentyx.  His psoriasis has been worsened unfortunately.  They are looking into consideration of other  biologic therapy.  He is taking high-dose simethicone (Phyzame 500 mg with minimal effect overall.  His weight had decreased somewhat unintentionally due to his alteration of bowel habits.  However it has stabilized overall.  He is only had a 4 pound weight loss over the last 6 months.  Additional stool studies have not been completed by his PCP to rule out as of yet.  He is still battling, with his wife, issues of deteriorating mentation and a likely new diagnosis of moderate to advanced Alzheimer's.  This is the biggest stressor that he has in his life  GI Review of Systems Positive as above Negative for dysphagia, odynophagia, nausea, vomiting, melena, hematochezia   Review of Systems General: Denies fevers/chills Cardiovascular: Denies chest pain Pulmonary: Denies shortness of breath Gastroenterological: See HPI Genitourinary: Denies darkened urine Hematological: Denies easy bruising/bleeding Dermatological: Denies jaundice Psychological: Mood is stable   Medications Current Outpatient Medications  Medication Sig Dispense Refill   aspirin 81 MG tablet Take 81 mg by mouth daily.     augmented betamethasone dipropionate (DIPROLENE-AF) 0.05 % cream Apply 1 application topically 2 (two) times daily.     rosuvastatin (CRESTOR) 10 MG tablet Take 10 mg by mouth daily.     Secukinumab (COSENTYX [place]) Inject 1 Dose into the skin every 30 (thirty) days.     venlafaxine XR (EFFEXOR-XR) 150 MG 24 hr capsule Take 1 capsule (150 mg total) by mouth daily with breakfast. 30 capsule 2   No current facility-administered medications for this  visit.    Allergies Allergies  Allergen Reactions   Salmon [Fish Allergy]    Valsartan     Decreased kidney function   Latex Rash    Histories Past Medical History:  Diagnosis Date   Arthritis    CKD (chronic kidney disease)    Colon polyps    Depression    Diabetes ([place])    Essential hypertension 06/11/2015   History of colon polyps     Hyperlipidemia 06/11/2015   Obesity (BMI 30.0-34.9) 06/11/2015   Obstructive sleep apnea on CPAP 06/11/2015   Psoriatic arthritis mutilans ([place]) 06/11/2015   Past Surgical History:  Procedure Laterality Date   COLONOSCOPY     Social History   Socioeconomic History   Marital status: Married    Spouse name: Not on file   Number of children: Not on file   Years of education: Not on file   Highest education level: Not on file  Occupational History   Occupation: Retired   Tobacco Use   Smoking status: Former    Packs/day: 1.50    Years: 20.00    Pack years: 30.00    Types: Cigarettes    Quit date: 01/03/1984    Years since quitting: 38.0   Smokeless tobacco: Never  Vaping Use   Vaping Use: Not on file  Substance and Sexual Activity   Alcohol use: Yes    Comment: occ   Drug use: No   Sexual activity: Not Currently  Other Topics Concern   Not on file  Social History Narrative   Not on file   Social Determinants of Health   Financial Resource Strain: Not on file  Food Insecurity: Not on file  Transportation Needs: Not on file  Physical Activity: Not on file  Stress: Not on file  Social Connections: Not on file  Intimate Partner Violence: Not on file   Family History  Problem Relation Age of Onset   Coronary artery disease Father    Heart disease Mother    Depression Brother    Schizophrenia Brother    Paranoid behavior Brother    ADD / ADHD Neg Hx    Alcohol abuse Neg Hx    Anxiety disorder Neg Hx    Bipolar disorder Neg Hx    Diabetes Mellitus II Neg Hx    Colon cancer Neg Hx    Stomach cancer Neg Hx    Pancreatic cancer Neg Hx    Esophageal cancer Neg Hx    Liver disease Neg Hx    Inflammatory bowel disease Neg Hx    Rectal cancer Neg Hx    I have reviewed his medical, social, and family history in detail and updated the electronic medical record as necessary.    PHYSICAL EXAMINATION  BP (!) 160/80    Pulse 82    Ht 5' 6.5" (1.689 m)    Wt 205 lb (93 kg)     BMI 32.59 kg/m  Wt Readings from Last 3 Encounters:  12/22/21 205 lb (93 kg)  08/24/21 209 lb (94.8 kg)  08/18/21 209 lb 8 oz (95 kg)  GEN: NAD, appears stated age, doesn't appear chronically ill PSYCH: Cooperative, without pressured speech EYE: Conjunctivae pink, sclerae anicteric ENT: MMM CV: Nontachycardic RESP: No audible wheezing GI: NABS, soft, protuberant abdomen, ventral diastases present, nontender, without rebound MSK/EXT: No lower extremity edema SKIN: No jaundice, no spider angiomata NEURO:  Alert & Oriented x 3, no focal deficits, no evidence of asterixis   REVIEW OF DATA  I reviewed the following data at the time of this encounter:  GI Procedures and Studies  September 2022 colonoscopy - Hemorrhoids found on digital rectal exam. - The examined portion of the ileum was normal. - Eight 2 to 10 mm polyps in the descending colon, at the hepatic flexure, in the ascending colon and in the cecum. - Normal mucosa in the entire examined colon otherwise. - Non-bleeding non-thrombosed external and internal hemorrhoids.  Pathology Diagnosis Surgical [P], colon, cecum, ascending, hepatic flex, descending, polyp (8) - TUBULAR ADENOMA (6 FRAGMENTS) - SESSILE SERRATED POLYP (2 FRAGMENTS) - MULTIPLE FRAGMENTS OF BENIGN COLONIC MUCOSA - NO HIGH-GRADE DYSPLASIA OR MALIGNANCY IDENTIFIED  Laboratory Studies  Reviewed those in epic and care everywhere  Imaging Studies  Previously reviewed  ASSESSMENT  Mr. Hamme is a 76 y.o. male with a pmh significant for diabetes (not on medications), obesity, OSA, psoriasis, MDD, hypertension, hyperlipidemia, prior colon polyps (TAs & SSPs), abnormal LFTs (per patient for years), elevated ferritin, fatty liver (per imaging).  The patient is seen today for evaluation and management of:  1. Change in bowel habits   2. Bloating   3. Gas pain   4. Irritable bowel syndrome with diarrhea   5. Hx of adenomatous colonic polyps    The patient  is hemodynamically stable.  Clinically, not clear what is causing such significant alteration of his bowel habits since his colonoscopy.  Sometimes, there can be a lactose deficiency that develops with certain colon preps, but the patient altered his diet and is still experiencing issues.  He has had an unintentional weight loss of approximately 4 pounds over the course of the last 6 months based on our scales here.  It is possible that the patient may be experiencing alteration of his bowel habits as a result of significant stressors surrounding his wife and her Alzheimer's diagnosis as well.  His use of probiotics has not been helpful overall.  His use of simethicone has only been helpful somewhat.  We are going to do additional stool studies and consider SIBO breath testing.  Patient may get benefit from bulking stool fiber in an effort of trying to keep things more regular.  If this is IBS-diarrhea he may get some benefit from rifaximin therapy.  Unlikely though possible, microscopic/collagenous colitis could be occurring though its not completely clear to me.  We will consider antispasmodics as well.  Low FODMAP diet discussed with patient briefly as well.  All patient questions were answered to the best of my ability, and the patient agrees to the aforementioned plan of action with follow-up as indicated.   PLAN  Stool studies as outlined below If negative work-up will need to consider SIBO breath testing and IBS that is in the treatment Benefiber or FiberCon initiation Consider antispasmodic Bentyl/Levsin if cramping persists Surveillance colonoscopy due in 2025 Trial low FODMAP diet as able We will follow-up LFTs later this year Alcohol minimization for now Holding on liver biopsy for now   Orders Placed This Encounter  Procedures   Stool Culture   Calprotectin, Fecal   Fecal fat, quantitative   Pancreatic elastase, fecal    New Prescriptions   No medications on file   Modified  Medications   No medications on file    Planned Follow Up No follow-ups on file.   Total Time in Face-to-Face and in Coordination of Care for patient including independent/personal interpretation/review of prior testing, medical history, examination, medication adjustment, communicating results with the patient directly, and documentation  with the EHR is 25 minutes.   Justice Britain, MD [facility] Office # [phone]

## 2021-12-26 ENCOUNTER — Other Ambulatory Visit: Payer: Medicare Other

## 2021-12-26 DIAGNOSIS — R141 Gas pain: Secondary | ICD-10-CM

## 2021-12-26 DIAGNOSIS — K580 Irritable bowel syndrome with diarrhea: Secondary | ICD-10-CM

## 2021-12-26 DIAGNOSIS — R194 Change in bowel habit: Secondary | ICD-10-CM

## 2021-12-26 DIAGNOSIS — R140 Abdominal distension (gaseous): Secondary | ICD-10-CM

## 2021-12-27 DIAGNOSIS — E669 Obesity, unspecified: Secondary | ICD-10-CM | POA: Diagnosis not present

## 2021-12-27 DIAGNOSIS — L401 Generalized pustular psoriasis: Secondary | ICD-10-CM | POA: Diagnosis not present

## 2021-12-27 DIAGNOSIS — L4050 Arthropathic psoriasis, unspecified: Secondary | ICD-10-CM | POA: Diagnosis not present

## 2021-12-27 DIAGNOSIS — R945 Abnormal results of liver function studies: Secondary | ICD-10-CM | POA: Diagnosis not present

## 2021-12-27 DIAGNOSIS — Z6831 Body mass index (BMI) 31.0-31.9, adult: Secondary | ICD-10-CM | POA: Diagnosis not present

## 2021-12-29 ENCOUNTER — Other Ambulatory Visit: Payer: Self-pay

## 2021-12-29 ENCOUNTER — Telehealth: Payer: Self-pay

## 2021-12-29 DIAGNOSIS — R194 Change in bowel habit: Secondary | ICD-10-CM

## 2021-12-29 LAB — CALPROTECTIN, FECAL: Calprotectin, Fecal: <16 ug/g (ref 0–120)

## 2021-12-29 NOTE — Telephone Encounter (Signed)
[facility] was unable to use the most recent stool sample to add on the cdiff panel. New orders are in per GM, and pt is aware that he will need to come in at his earliest convenience for another stool sample.

## 2021-12-30 LAB — STOOL CULTURE: E coli, Shiga toxin Assay: NEGATIVE

## 2022-01-02 ENCOUNTER — Other Ambulatory Visit: Payer: Medicare Other

## 2022-01-02 DIAGNOSIS — R194 Change in bowel habit: Secondary | ICD-10-CM

## 2022-01-03 ENCOUNTER — Encounter: Payer: Self-pay | Admitting: Gastroenterology

## 2022-01-04 ENCOUNTER — Encounter: Payer: Self-pay | Admitting: Gastroenterology

## 2022-01-04 LAB — FECAL FAT, QUANTITATIVE
Fat Quant, Stl Collec: 24 h
Fecal Lipids, 24 hr: 1.2 g/(24.h) (ref <7.0)
Specimen Total Weight: 33 g

## 2022-01-04 LAB — CLOSTRIDIUM DIFFICILE BY PCR: Toxigenic C. Difficile by PCR: NEGATIVE

## 2022-01-04 LAB — PANCREATIC ELASTASE, FECAL: Pancreatic Elastase-1, Stool: 327 mcg/g

## 2022-01-04 NOTE — Telephone Encounter (Signed)
I want to give a trial of samples of Creon to see if any difference is made, even though Fecal elastase was within normal limits. 72K with each meal x 10-days. If this doesn't help, then I would recommend SIBO breath testing vs IBS-D treatment attempt with Rifaximin 550 mg TID x 14 days. Thanks. GM

## 2022-01-08 DIAGNOSIS — G4733 Obstructive sleep apnea (adult) (pediatric): Secondary | ICD-10-CM | POA: Diagnosis not present

## 2022-01-15 DIAGNOSIS — E113293 Type 2 diabetes mellitus with mild nonproliferative diabetic retinopathy without macular edema, bilateral: Secondary | ICD-10-CM | POA: Diagnosis not present

## 2022-01-15 DIAGNOSIS — H3500 Unspecified background retinopathy: Secondary | ICD-10-CM | POA: Diagnosis not present

## 2022-01-15 DIAGNOSIS — Z961 Presence of intraocular lens: Secondary | ICD-10-CM | POA: Diagnosis not present

## 2022-01-15 DIAGNOSIS — H52203 Unspecified astigmatism, bilateral: Secondary | ICD-10-CM | POA: Diagnosis not present

## 2022-02-28 DIAGNOSIS — E1122 Type 2 diabetes mellitus with diabetic chronic kidney disease: Secondary | ICD-10-CM | POA: Diagnosis not present

## 2022-02-28 DIAGNOSIS — R945 Abnormal results of liver function studies: Secondary | ICD-10-CM | POA: Diagnosis not present

## 2022-02-28 DIAGNOSIS — Z636 Dependent relative needing care at home: Secondary | ICD-10-CM | POA: Diagnosis not present

## 2022-02-28 DIAGNOSIS — Z0000 Encounter for general adult medical examination without abnormal findings: Secondary | ICD-10-CM | POA: Diagnosis not present

## 2022-02-28 DIAGNOSIS — N183 Chronic kidney disease, stage 3 unspecified: Secondary | ICD-10-CM | POA: Diagnosis not present

## 2022-02-28 DIAGNOSIS — E291 Testicular hypofunction: Secondary | ICD-10-CM | POA: Diagnosis not present

## 2022-02-28 DIAGNOSIS — E669 Obesity, unspecified: Secondary | ICD-10-CM | POA: Diagnosis not present

## 2022-02-28 DIAGNOSIS — R197 Diarrhea, unspecified: Secondary | ICD-10-CM | POA: Diagnosis not present

## 2022-02-28 DIAGNOSIS — E782 Mixed hyperlipidemia: Secondary | ICD-10-CM | POA: Diagnosis not present

## 2022-02-28 DIAGNOSIS — G4733 Obstructive sleep apnea (adult) (pediatric): Secondary | ICD-10-CM | POA: Diagnosis not present

## 2022-02-28 DIAGNOSIS — I10 Essential (primary) hypertension: Secondary | ICD-10-CM | POA: Diagnosis not present

## 2022-02-28 DIAGNOSIS — L4052 Psoriatic arthritis mutilans: Secondary | ICD-10-CM | POA: Diagnosis not present

## 2022-03-07 ENCOUNTER — Encounter: Payer: Self-pay | Admitting: Gastroenterology

## 2022-03-07 NOTE — Progress Notes (Signed)
Outside laboratories to be scanned into the chart. ? ? ?February 28, 2022 labs ?Hemoglobin A1c 10.2 ?Sodium 140 ?Potassium 4.2 ?AST/ALT 47/59 ?Alk phos 80 ?T. bili 0.6 ? ? ?

## 2022-03-20 DIAGNOSIS — L4050 Arthropathic psoriasis, unspecified: Secondary | ICD-10-CM | POA: Diagnosis not present

## 2022-03-20 DIAGNOSIS — R945 Abnormal results of liver function studies: Secondary | ICD-10-CM | POA: Diagnosis not present

## 2022-03-20 DIAGNOSIS — L401 Generalized pustular psoriasis: Secondary | ICD-10-CM | POA: Diagnosis not present

## 2022-03-20 DIAGNOSIS — Z6831 Body mass index (BMI) 31.0-31.9, adult: Secondary | ICD-10-CM | POA: Diagnosis not present

## 2022-03-20 DIAGNOSIS — E669 Obesity, unspecified: Secondary | ICD-10-CM | POA: Diagnosis not present

## 2022-03-21 DIAGNOSIS — G4733 Obstructive sleep apnea (adult) (pediatric): Secondary | ICD-10-CM | POA: Diagnosis not present

## 2022-06-29 DIAGNOSIS — E782 Mixed hyperlipidemia: Secondary | ICD-10-CM | POA: Diagnosis not present

## 2022-06-29 DIAGNOSIS — N183 Chronic kidney disease, stage 3 unspecified: Secondary | ICD-10-CM | POA: Diagnosis not present

## 2022-06-29 DIAGNOSIS — R197 Diarrhea, unspecified: Secondary | ICD-10-CM | POA: Diagnosis not present

## 2022-06-29 DIAGNOSIS — I10 Essential (primary) hypertension: Secondary | ICD-10-CM | POA: Diagnosis not present

## 2022-06-29 DIAGNOSIS — E669 Obesity, unspecified: Secondary | ICD-10-CM | POA: Diagnosis not present

## 2022-06-29 DIAGNOSIS — E1122 Type 2 diabetes mellitus with diabetic chronic kidney disease: Secondary | ICD-10-CM | POA: Diagnosis not present

## 2022-07-24 DIAGNOSIS — R945 Abnormal results of liver function studies: Secondary | ICD-10-CM | POA: Diagnosis not present

## 2022-07-24 DIAGNOSIS — L4050 Arthropathic psoriasis, unspecified: Secondary | ICD-10-CM | POA: Diagnosis not present

## 2022-07-24 DIAGNOSIS — Z6831 Body mass index (BMI) 31.0-31.9, adult: Secondary | ICD-10-CM | POA: Diagnosis not present

## 2022-07-24 DIAGNOSIS — L401 Generalized pustular psoriasis: Secondary | ICD-10-CM | POA: Diagnosis not present

## 2022-07-24 DIAGNOSIS — E669 Obesity, unspecified: Secondary | ICD-10-CM | POA: Diagnosis not present

## 2022-09-06 DIAGNOSIS — R143 Flatulence: Secondary | ICD-10-CM | POA: Diagnosis not present

## 2022-09-06 DIAGNOSIS — R197 Diarrhea, unspecified: Secondary | ICD-10-CM | POA: Diagnosis not present

## 2022-09-06 DIAGNOSIS — R109 Unspecified abdominal pain: Secondary | ICD-10-CM | POA: Diagnosis not present

## 2022-10-11 DIAGNOSIS — Z6830 Body mass index (BMI) 30.0-30.9, adult: Secondary | ICD-10-CM | POA: Diagnosis not present

## 2022-10-11 DIAGNOSIS — E669 Obesity, unspecified: Secondary | ICD-10-CM | POA: Diagnosis not present

## 2022-10-11 DIAGNOSIS — L4050 Arthropathic psoriasis, unspecified: Secondary | ICD-10-CM | POA: Diagnosis not present

## 2022-10-11 DIAGNOSIS — L401 Generalized pustular psoriasis: Secondary | ICD-10-CM | POA: Diagnosis not present

## 2022-10-11 DIAGNOSIS — R945 Abnormal results of liver function studies: Secondary | ICD-10-CM | POA: Diagnosis not present

## 2022-11-14 DIAGNOSIS — N183 Chronic kidney disease, stage 3 unspecified: Secondary | ICD-10-CM | POA: Diagnosis not present

## 2022-11-14 DIAGNOSIS — E669 Obesity, unspecified: Secondary | ICD-10-CM | POA: Diagnosis not present

## 2022-11-14 DIAGNOSIS — E291 Testicular hypofunction: Secondary | ICD-10-CM | POA: Diagnosis not present

## 2022-11-14 DIAGNOSIS — E782 Mixed hyperlipidemia: Secondary | ICD-10-CM | POA: Diagnosis not present

## 2022-11-14 DIAGNOSIS — E1122 Type 2 diabetes mellitus with diabetic chronic kidney disease: Secondary | ICD-10-CM | POA: Diagnosis not present

## 2022-11-14 DIAGNOSIS — R197 Diarrhea, unspecified: Secondary | ICD-10-CM | POA: Diagnosis not present

## 2022-12-25 IMAGING — MR MR ABDOMEN WO/W CM MRCP
17 of 22 series · 39 of 48 positions shown · IV contrast (gadavist)
Comparison: Ultrasound on 02/24/2021

CLINICAL DATA: Elevated liver function tests. Alcohol use disorder.
Mildly dilated common bile duct on recent ultrasound.

EXAM:
MRI ABDOMEN WITHOUT AND WITH CONTRAST (INCLUDING MRCP)
TECHNIQUE: Multiplanar multisequence MR imaging of the abdomen was performed
both before and after the administration of intravenous contrast.
Heavily T2-weighted images of the biliary and pancreatic ducts were
obtained, and three-dimensional MRCP images were rendered by post
processing.
CONTRAST:  10mL GADAVIST GADOBUTROL 1 MMOL/ML IV SOLN

[Series 3: T2 fat-sat · axial · 6.0mm · 1.31mm/px · 1 of 40 slices shown]
[im 1/40]
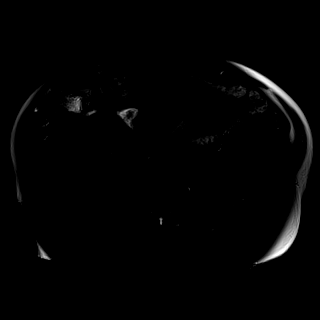

[Series 4: DWI · axial · 6.0mm · 1.60mm/px · z∈[-190,+91]mm · 2 of 80 slices shown (1 of 2)]
[im 1/80]
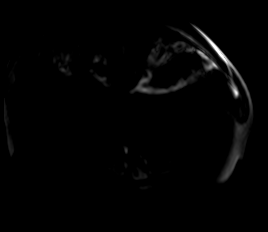
[im 80/80]
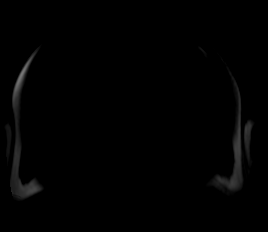

[Series 5: DWI · axial · 6.0mm · 1.60mm/px · 1 of 40 slices shown (2 of 2)]
[im 1/40]
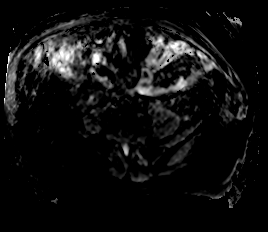

[Series 9: T2 · coronal · 6.0mm · 1.68mm/px · 2 of 50 slices shown (1 of 2)]
[im 1/50]
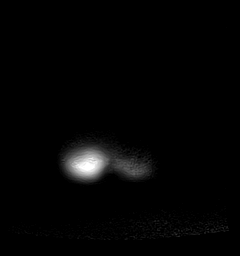
[im 50/50]
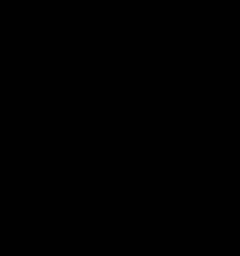

[Series 10: T1 · axial · 3.3mm · 1.31mm/px · z∈[-187,+48]mm · 2 of 72 slices shown (1 of 2)]
[im 1/72]
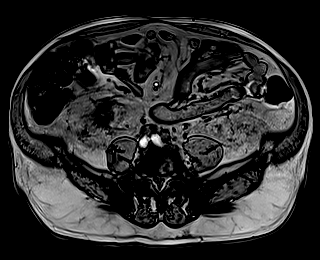
[im 72/72]
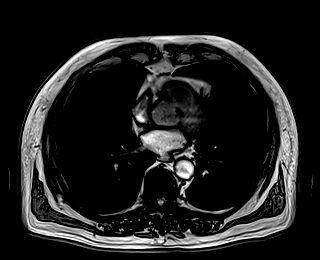

[Series 11: T1 · axial · 3.3mm · 1.31mm/px · z∈[-187,+48]mm · 2 of 72 slices shown (2 of 2)]
[im 1/72]
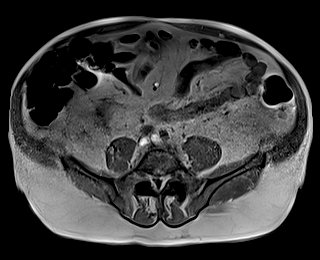
[im 72/72]
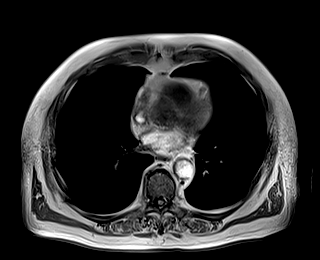

[Series 16: cor_3d_spc_trig · coronal · 1.0mm · 0.49mm/px · 3 of 80 slices shown]
[im 1/80]
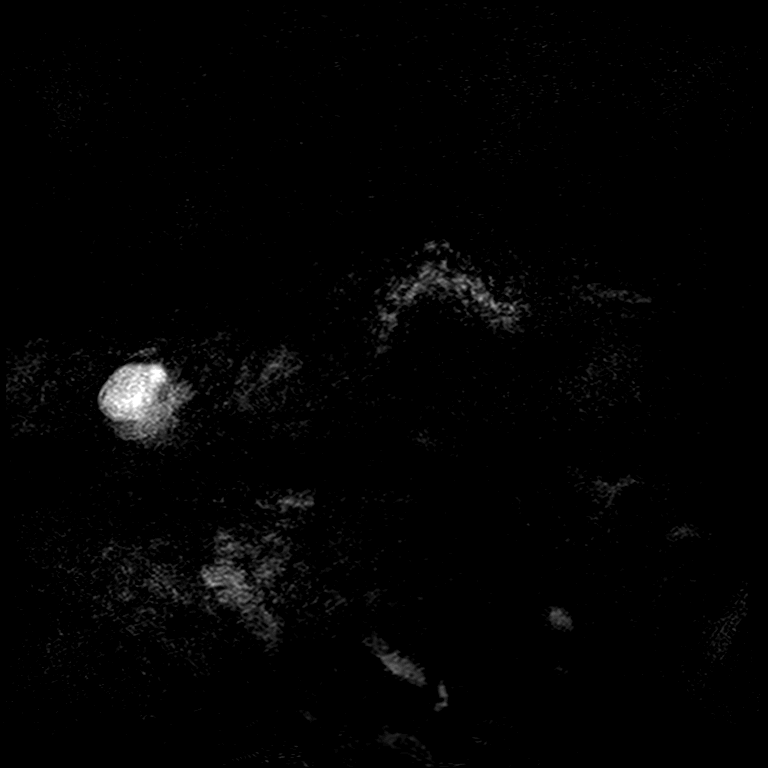
[im 40/80]
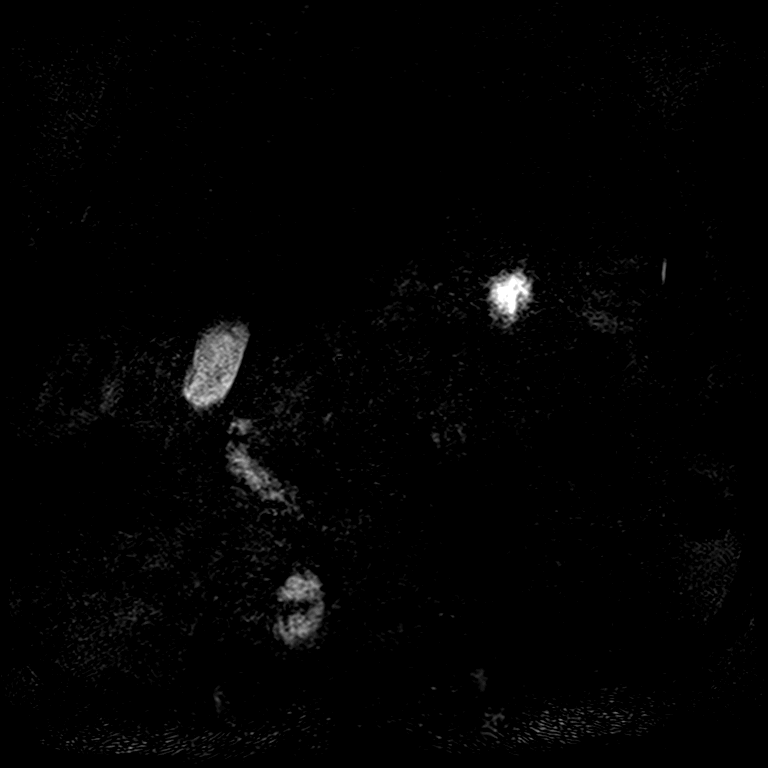
[im 80/80]
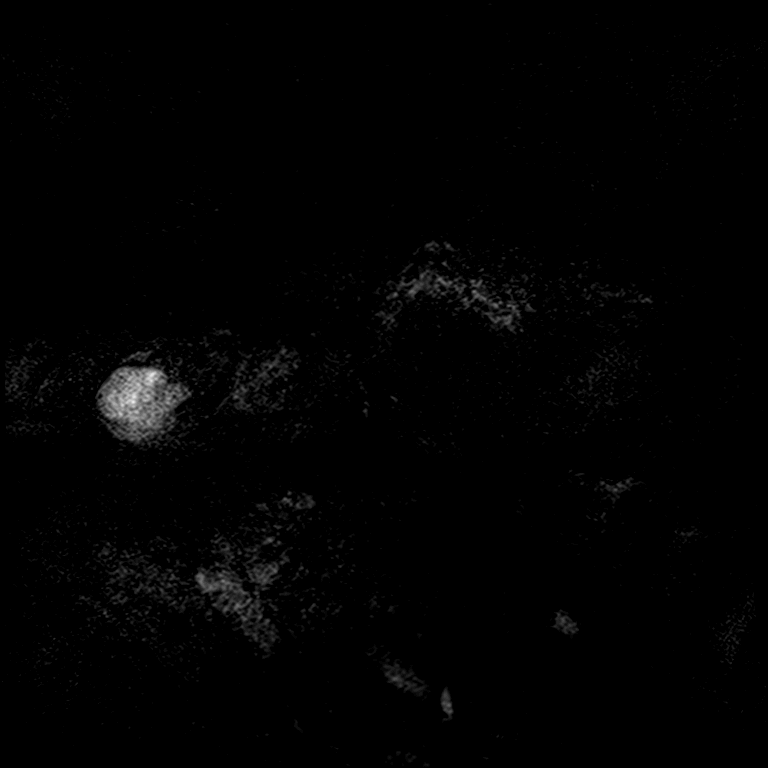

[Series 18: T2 · axial · 6.0mm · 1.56mm/px · 1 of 40 slices shown (2 of 2)]
[im 1/40]
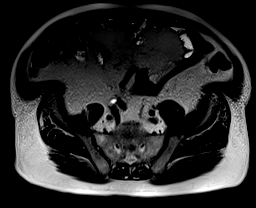

[Series 20: T1 dynamic · axial · 3.0mm · 1.31mm/px · z∈[-214,+47]mm · 3 of 88 slices shown (1 of 9)]
[im 1/88]
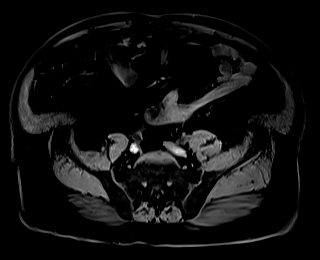
[im 44/88]
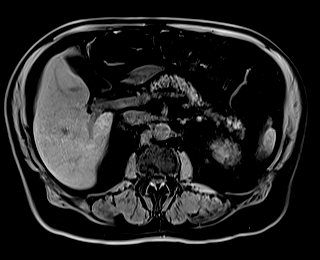
[im 88/88]
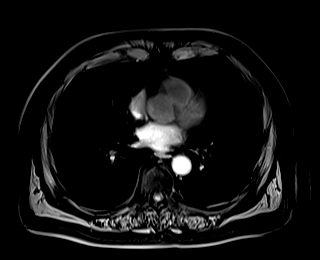

[Series 24: T1 dynamic · axial · 3.0mm · 1.31mm/px · z∈[-214,+47]mm · 3 of 88 slices shown (2 of 9)]
[im 1/88]
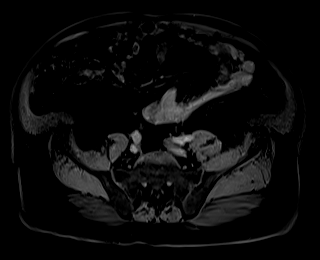
[im 44/88]
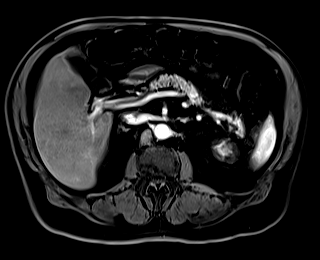
[im 88/88]
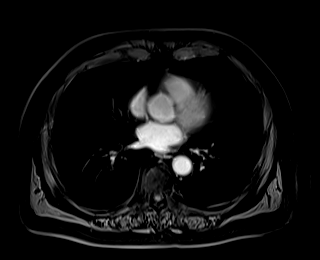

[Series 25: T1 dynamic · axial · 3.0mm · 1.31mm/px · z∈[-214,+47]mm · 3 of 88 slices shown (3 of 9)]
[im 1/88]
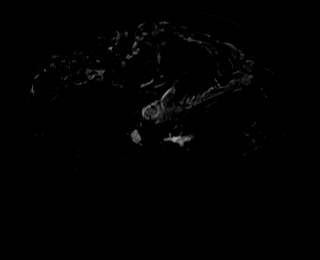
[im 44/88]
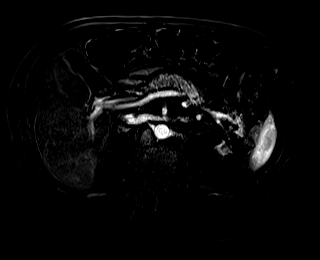
[im 88/88]
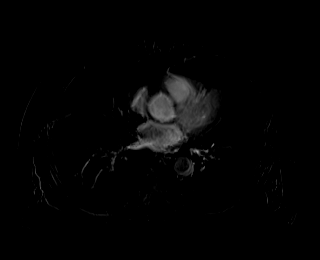

[Series 28: T1 dynamic · axial · 3.0mm · 1.31mm/px · z∈[-214,+47]mm · 3 of 88 slices shown (4 of 9)]
[im 1/88]
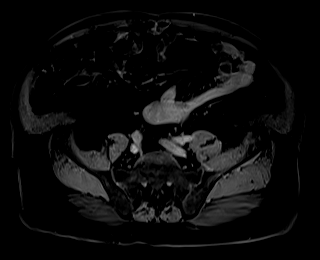
[im 44/88]
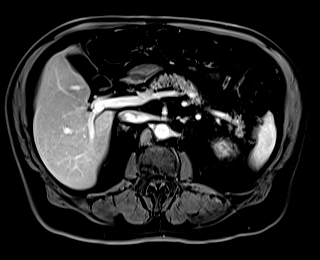
[im 88/88]
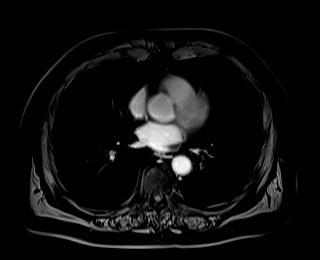

[Series 29: T1 dynamic · axial · 3.0mm · 1.31mm/px · z∈[-214,+47]mm · 3 of 88 slices shown (5 of 9)]
[im 1/88]
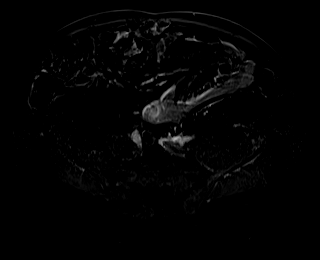
[im 44/88]
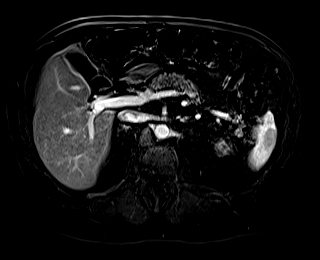
[im 88/88]
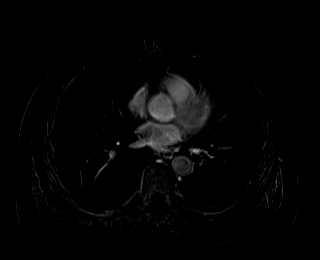

[Series 32: T1 dynamic · axial · 3.0mm · 1.31mm/px · z∈[-214,+47]mm · 3 of 88 slices shown (6 of 9)]
[im 1/88]
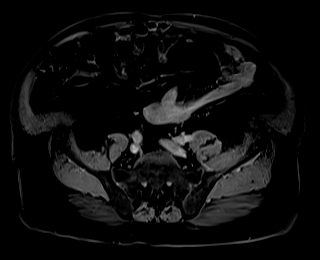
[im 44/88]
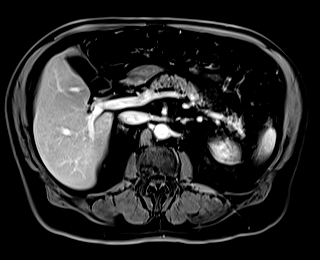
[im 88/88]
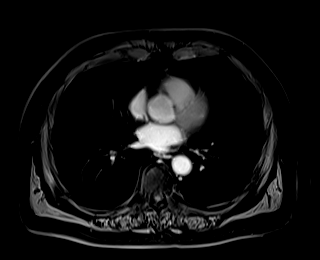

[Series 33: T1 dynamic · axial · 3.0mm · 1.31mm/px · z∈[-214,+47]mm · 3 of 88 slices shown (7 of 9)]
[im 1/88]
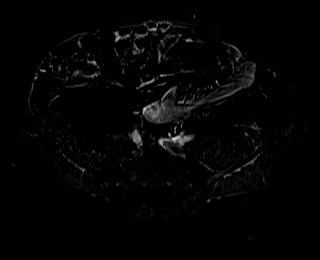
[im 44/88]
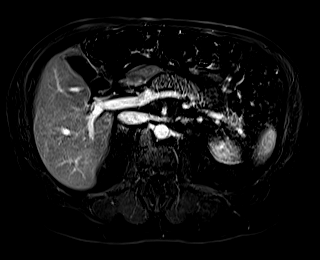
[im 88/88]
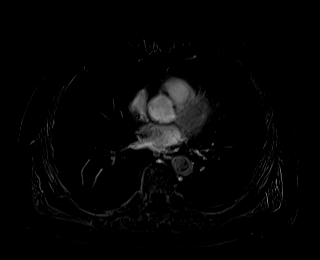

[Series 35: T1 dynamic · coronal · 5.0mm · 1.41mm/px · 2 of 64 slices shown (8 of 9)]
[im 1/64]
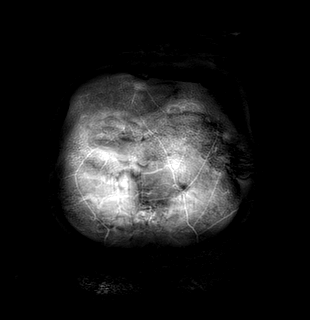
[im 64/64]
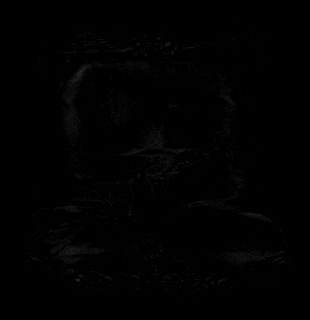

[Series 38: T1 dynamic · axial · 3.0mm · 1.31mm/px · z∈[-214,-85]mm · 2 of 88 slices shown (9 of 9)]
[im 1/88]
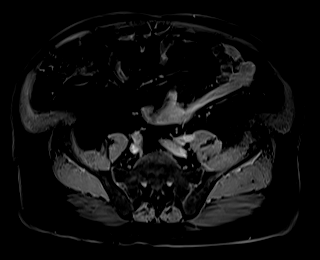
[im 44/88]
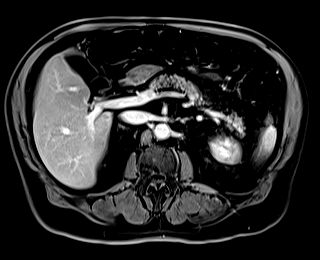

[39 of 48 positions shown; findings below may reference images not displayed]

FINDINGS: Lower chest: No acute findings.

Hepatobiliary: No hepatic masses identified. Mild diffuse hepatic
steatosis seen on chemical shift imaging. Gallbladder is
unremarkable. No evidence of biliary ductal dilatation, with common
bile duct measuring 2 mm in diameter. No evidence of
choledocholithiasis or biliary stricture.

Pancreas: No mass or inflammatory changes. No evidence of pancreatic
ductal dilatation or pancreas divisum.

Spleen:  Within normal limits in size and appearance.

Adrenals/Urinary Tract: No masses identified. No evidence of
hydronephrosis.

Stomach/Bowel: Visualized portion unremarkable.

Vascular/Lymphatic: No pathologically enlarged lymph nodes
identified. No abdominal aortic aneurysm.

Other:  None.

Musculoskeletal:  No suspicious bone lesions identified.
IMPRESSION: Mild diffuse hepatic steatosis.

No evidence of biliary ductal dilatation, choledocholithiasis, or
other acute findings.

## 2023-01-16 DIAGNOSIS — E113293 Type 2 diabetes mellitus with mild nonproliferative diabetic retinopathy without macular edema, bilateral: Secondary | ICD-10-CM | POA: Diagnosis not present

## 2023-01-16 DIAGNOSIS — H52203 Unspecified astigmatism, bilateral: Secondary | ICD-10-CM | POA: Diagnosis not present

## 2023-01-16 DIAGNOSIS — Z961 Presence of intraocular lens: Secondary | ICD-10-CM | POA: Diagnosis not present

## 2023-02-07 DIAGNOSIS — Z6831 Body mass index (BMI) 31.0-31.9, adult: Secondary | ICD-10-CM | POA: Diagnosis not present

## 2023-02-07 DIAGNOSIS — L401 Generalized pustular psoriasis: Secondary | ICD-10-CM | POA: Diagnosis not present

## 2023-02-07 DIAGNOSIS — R945 Abnormal results of liver function studies: Secondary | ICD-10-CM | POA: Diagnosis not present

## 2023-02-07 DIAGNOSIS — E669 Obesity, unspecified: Secondary | ICD-10-CM | POA: Diagnosis not present

## 2023-02-07 DIAGNOSIS — L4050 Arthropathic psoriasis, unspecified: Secondary | ICD-10-CM | POA: Diagnosis not present

## 2023-02-07 DIAGNOSIS — Z111 Encounter for screening for respiratory tuberculosis: Secondary | ICD-10-CM | POA: Diagnosis not present

## 2023-03-06 DIAGNOSIS — L4052 Psoriatic arthritis mutilans: Secondary | ICD-10-CM | POA: Diagnosis not present

## 2023-03-06 DIAGNOSIS — Z636 Dependent relative needing care at home: Secondary | ICD-10-CM | POA: Diagnosis not present

## 2023-03-06 DIAGNOSIS — E1122 Type 2 diabetes mellitus with diabetic chronic kidney disease: Secondary | ICD-10-CM | POA: Diagnosis not present

## 2023-03-06 DIAGNOSIS — Z0000 Encounter for general adult medical examination without abnormal findings: Secondary | ICD-10-CM | POA: Diagnosis not present

## 2023-03-06 DIAGNOSIS — E669 Obesity, unspecified: Secondary | ICD-10-CM | POA: Diagnosis not present

## 2023-03-06 DIAGNOSIS — N183 Chronic kidney disease, stage 3 unspecified: Secondary | ICD-10-CM | POA: Diagnosis not present

## 2023-03-06 DIAGNOSIS — G4733 Obstructive sleep apnea (adult) (pediatric): Secondary | ICD-10-CM | POA: Diagnosis not present

## 2023-03-06 DIAGNOSIS — E782 Mixed hyperlipidemia: Secondary | ICD-10-CM | POA: Diagnosis not present

## 2023-03-06 DIAGNOSIS — E291 Testicular hypofunction: Secondary | ICD-10-CM | POA: Diagnosis not present

## 2023-03-27 DIAGNOSIS — G4733 Obstructive sleep apnea (adult) (pediatric): Secondary | ICD-10-CM | POA: Diagnosis not present

## 2023-06-11 DIAGNOSIS — E669 Obesity, unspecified: Secondary | ICD-10-CM | POA: Diagnosis not present

## 2023-06-11 DIAGNOSIS — N183 Chronic kidney disease, stage 3 unspecified: Secondary | ICD-10-CM | POA: Diagnosis not present

## 2023-06-11 DIAGNOSIS — J309 Allergic rhinitis, unspecified: Secondary | ICD-10-CM | POA: Diagnosis not present

## 2023-06-11 DIAGNOSIS — E1122 Type 2 diabetes mellitus with diabetic chronic kidney disease: Secondary | ICD-10-CM | POA: Diagnosis not present

## 2023-06-11 DIAGNOSIS — E291 Testicular hypofunction: Secondary | ICD-10-CM | POA: Diagnosis not present

## 2023-06-11 DIAGNOSIS — I10 Essential (primary) hypertension: Secondary | ICD-10-CM | POA: Diagnosis not present

## 2023-06-11 DIAGNOSIS — E782 Mixed hyperlipidemia: Secondary | ICD-10-CM | POA: Diagnosis not present

## 2023-06-12 DIAGNOSIS — Z6831 Body mass index (BMI) 31.0-31.9, adult: Secondary | ICD-10-CM | POA: Diagnosis not present

## 2023-06-12 DIAGNOSIS — L401 Generalized pustular psoriasis: Secondary | ICD-10-CM | POA: Diagnosis not present

## 2023-06-12 DIAGNOSIS — R945 Abnormal results of liver function studies: Secondary | ICD-10-CM | POA: Diagnosis not present

## 2023-06-12 DIAGNOSIS — E669 Obesity, unspecified: Secondary | ICD-10-CM | POA: Diagnosis not present

## 2023-06-12 DIAGNOSIS — L4050 Arthropathic psoriasis, unspecified: Secondary | ICD-10-CM | POA: Diagnosis not present

## 2023-09-18 DIAGNOSIS — E291 Testicular hypofunction: Secondary | ICD-10-CM | POA: Diagnosis not present

## 2023-09-18 DIAGNOSIS — J309 Allergic rhinitis, unspecified: Secondary | ICD-10-CM | POA: Diagnosis not present

## 2023-09-18 DIAGNOSIS — E1122 Type 2 diabetes mellitus with diabetic chronic kidney disease: Secondary | ICD-10-CM | POA: Diagnosis not present

## 2023-09-18 DIAGNOSIS — E669 Obesity, unspecified: Secondary | ICD-10-CM | POA: Diagnosis not present

## 2023-09-18 DIAGNOSIS — E11319 Type 2 diabetes mellitus with unspecified diabetic retinopathy without macular edema: Secondary | ICD-10-CM | POA: Diagnosis not present

## 2023-09-18 DIAGNOSIS — E782 Mixed hyperlipidemia: Secondary | ICD-10-CM | POA: Diagnosis not present

## 2023-09-18 DIAGNOSIS — Z23 Encounter for immunization: Secondary | ICD-10-CM | POA: Diagnosis not present

## 2023-09-18 DIAGNOSIS — N183 Chronic kidney disease, stage 3 unspecified: Secondary | ICD-10-CM | POA: Diagnosis not present

## 2023-09-25 DIAGNOSIS — Z6831 Body mass index (BMI) 31.0-31.9, adult: Secondary | ICD-10-CM | POA: Diagnosis not present

## 2023-09-25 DIAGNOSIS — L4050 Arthropathic psoriasis, unspecified: Secondary | ICD-10-CM | POA: Diagnosis not present

## 2023-09-25 DIAGNOSIS — R945 Abnormal results of liver function studies: Secondary | ICD-10-CM | POA: Diagnosis not present

## 2023-09-25 DIAGNOSIS — E669 Obesity, unspecified: Secondary | ICD-10-CM | POA: Diagnosis not present

## 2023-09-25 DIAGNOSIS — L401 Generalized pustular psoriasis: Secondary | ICD-10-CM | POA: Diagnosis not present

## 2023-12-24 DIAGNOSIS — E291 Testicular hypofunction: Secondary | ICD-10-CM | POA: Diagnosis not present

## 2023-12-24 DIAGNOSIS — E782 Mixed hyperlipidemia: Secondary | ICD-10-CM | POA: Diagnosis not present

## 2024-01-02 DIAGNOSIS — R945 Abnormal results of liver function studies: Secondary | ICD-10-CM | POA: Diagnosis not present

## 2024-01-02 DIAGNOSIS — E669 Obesity, unspecified: Secondary | ICD-10-CM | POA: Diagnosis not present

## 2024-01-02 DIAGNOSIS — L4050 Arthropathic psoriasis, unspecified: Secondary | ICD-10-CM | POA: Diagnosis not present

## 2024-01-02 DIAGNOSIS — Z6833 Body mass index (BMI) 33.0-33.9, adult: Secondary | ICD-10-CM | POA: Diagnosis not present

## 2024-01-02 DIAGNOSIS — L401 Generalized pustular psoriasis: Secondary | ICD-10-CM | POA: Diagnosis not present

## 2024-03-24 DIAGNOSIS — Z6832 Body mass index (BMI) 32.0-32.9, adult: Secondary | ICD-10-CM | POA: Diagnosis not present

## 2024-03-24 DIAGNOSIS — L4050 Arthropathic psoriasis, unspecified: Secondary | ICD-10-CM | POA: Diagnosis not present

## 2024-03-24 DIAGNOSIS — R945 Abnormal results of liver function studies: Secondary | ICD-10-CM | POA: Diagnosis not present

## 2024-03-24 DIAGNOSIS — E669 Obesity, unspecified: Secondary | ICD-10-CM | POA: Diagnosis not present

## 2024-03-24 DIAGNOSIS — L401 Generalized pustular psoriasis: Secondary | ICD-10-CM | POA: Diagnosis not present

## 2024-06-02 DIAGNOSIS — Z1331 Encounter for screening for depression: Secondary | ICD-10-CM | POA: Diagnosis not present

## 2024-06-02 DIAGNOSIS — E669 Obesity, unspecified: Secondary | ICD-10-CM | POA: Diagnosis not present

## 2024-06-02 DIAGNOSIS — Z0000 Encounter for general adult medical examination without abnormal findings: Secondary | ICD-10-CM | POA: Diagnosis not present

## 2024-06-02 DIAGNOSIS — E1122 Type 2 diabetes mellitus with diabetic chronic kidney disease: Secondary | ICD-10-CM | POA: Diagnosis not present

## 2024-06-02 DIAGNOSIS — G4733 Obstructive sleep apnea (adult) (pediatric): Secondary | ICD-10-CM | POA: Diagnosis not present

## 2024-06-02 DIAGNOSIS — L4052 Psoriatic arthritis mutilans: Secondary | ICD-10-CM | POA: Diagnosis not present

## 2024-06-02 DIAGNOSIS — Z636 Dependent relative needing care at home: Secondary | ICD-10-CM | POA: Diagnosis not present

## 2024-06-02 DIAGNOSIS — N183 Chronic kidney disease, stage 3 unspecified: Secondary | ICD-10-CM | POA: Diagnosis not present

## 2024-06-02 DIAGNOSIS — E782 Mixed hyperlipidemia: Secondary | ICD-10-CM | POA: Diagnosis not present

## 2024-06-02 DIAGNOSIS — E291 Testicular hypofunction: Secondary | ICD-10-CM | POA: Diagnosis not present

## 2024-06-24 DIAGNOSIS — E669 Obesity, unspecified: Secondary | ICD-10-CM | POA: Diagnosis not present

## 2024-06-24 DIAGNOSIS — R945 Abnormal results of liver function studies: Secondary | ICD-10-CM | POA: Diagnosis not present

## 2024-06-24 DIAGNOSIS — L401 Generalized pustular psoriasis: Secondary | ICD-10-CM | POA: Diagnosis not present

## 2024-06-24 DIAGNOSIS — L4050 Arthropathic psoriasis, unspecified: Secondary | ICD-10-CM | POA: Diagnosis not present

## 2024-06-24 DIAGNOSIS — Z6831 Body mass index (BMI) 31.0-31.9, adult: Secondary | ICD-10-CM | POA: Diagnosis not present

## 2024-07-27 DIAGNOSIS — L280 Lichen simplex chronicus: Secondary | ICD-10-CM | POA: Diagnosis not present

## 2024-07-27 DIAGNOSIS — L400 Psoriasis vulgaris: Secondary | ICD-10-CM | POA: Diagnosis not present

## 2024-09-17 ENCOUNTER — Encounter: Payer: Self-pay | Admitting: Gastroenterology

## 2024-09-24 DIAGNOSIS — L4050 Arthropathic psoriasis, unspecified: Secondary | ICD-10-CM | POA: Diagnosis not present

## 2024-10-21 DIAGNOSIS — Z111 Encounter for screening for respiratory tuberculosis: Secondary | ICD-10-CM | POA: Diagnosis not present

## 2024-10-21 DIAGNOSIS — Z6830 Body mass index (BMI) 30.0-30.9, adult: Secondary | ICD-10-CM | POA: Diagnosis not present

## 2024-10-21 DIAGNOSIS — E669 Obesity, unspecified: Secondary | ICD-10-CM | POA: Diagnosis not present

## 2024-10-21 DIAGNOSIS — L4050 Arthropathic psoriasis, unspecified: Secondary | ICD-10-CM | POA: Diagnosis not present

## 2024-10-21 DIAGNOSIS — R945 Abnormal results of liver function studies: Secondary | ICD-10-CM | POA: Diagnosis not present

## 2024-10-21 DIAGNOSIS — L401 Generalized pustular psoriasis: Secondary | ICD-10-CM | POA: Diagnosis not present

## 2024-10-31 ENCOUNTER — Encounter: Payer: Self-pay | Admitting: Gastroenterology

## 2024-11-13 DIAGNOSIS — Z961 Presence of intraocular lens: Secondary | ICD-10-CM | POA: Diagnosis not present

## 2024-11-13 DIAGNOSIS — H3500 Unspecified background retinopathy: Secondary | ICD-10-CM | POA: Diagnosis not present

## 2024-11-18 DIAGNOSIS — R6889 Other general symptoms and signs: Secondary | ICD-10-CM | POA: Diagnosis not present

## 2024-11-18 DIAGNOSIS — E782 Mixed hyperlipidemia: Secondary | ICD-10-CM | POA: Diagnosis not present

## 2024-11-18 DIAGNOSIS — E1122 Type 2 diabetes mellitus with diabetic chronic kidney disease: Secondary | ICD-10-CM | POA: Diagnosis not present
